# Patient Record
Sex: Female | Born: 1995 | Race: White | Hispanic: No | Marital: Single | Smoking: Never smoker
Health system: Southern US, Community
[De-identification: ages and names within clinical notes are randomized; demographics above are authoritative.]

## PROBLEM LIST (undated history)

## (undated) DIAGNOSIS — S42302A Unspecified fracture of shaft of humerus, left arm, initial encounter for closed fracture: Secondary | ICD-10-CM

## (undated) DIAGNOSIS — S42301A Unspecified fracture of shaft of humerus, right arm, initial encounter for closed fracture: Secondary | ICD-10-CM

## (undated) HISTORY — DX: Unspecified fracture of shaft of humerus, left arm, initial encounter for closed fracture: S42.302A

## (undated) HISTORY — PX: TONSILLECTOMY: SUR1361

## (undated) HISTORY — DX: Unspecified fracture of shaft of humerus, right arm, initial encounter for closed fracture: S42.301A

---
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
## 2016-05-02 ENCOUNTER — Encounter: Payer: Self-pay | Admitting: Family Medicine

## 2016-05-02 ENCOUNTER — Ambulatory Visit (INDEPENDENT_AMBULATORY_CARE_PROVIDER_SITE_OTHER): Payer: 59 | Admitting: Family Medicine

## 2016-05-02 VITALS — BP 109/66 | HR 86 | Wt 194.0 lb

## 2016-05-02 DIAGNOSIS — Z3009 Encounter for other general counseling and advice on contraception: Secondary | ICD-10-CM

## 2016-05-02 DIAGNOSIS — G43001 Migraine without aura, not intractable, with status migrainosus: Secondary | ICD-10-CM | POA: Diagnosis not present

## 2016-05-02 DIAGNOSIS — R41840 Attention and concentration deficit: Secondary | ICD-10-CM

## 2016-05-02 MED ORDER — SUMATRIPTAN SUCCINATE 100 MG PO TABS: 100.0000 mg | ORAL_TABLET | ORAL | 1 refills | Status: DC | PRN

## 2016-05-02 MED ORDER — PREDNISONE 10 MG PO TABS: ORAL_TABLET | ORAL | 0 refills | Status: DC

## 2016-05-02 MED ORDER — NORGESTIMATE-ETH ESTRADIOL 0.25-35 MG-MCG PO TABS: ORAL_TABLET | ORAL | 6 refills | Status: DC

## 2016-05-02 NOTE — Progress Notes (Signed)
Subjective:    CC: HAs  HPI:20 year old otherwise healthy female comes in today complaining of 3 weeks of frontal headaches. They sent to start in her 4 head and then radiates backward. She actually gets nauseated with them and rates her pain a 10 out of 10. She denies any visual changes. Her last eye exam was approximately a year ago. No real alleviating factors though turning off the lights does seem to help. No family history of migraine headaches. She describes it as a pressure type pain when it first starts. She does drink 2 caffeinated beverages (soda) per day and occasionally tea as well. Tried ibuprofen and Tylenol, neither relieved her symptoms. She feels like she is sleeping okay. No recent dietary changes. She does not exercise regularly.   She has been more stressed recently. 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 She says she typically goes in at 5 AM and works an 8 hour shift. She says she usually does not get a break and so doesn't get to eat or drink anything. She's been starting to experience some low back pain.  Having problems focusing and staying on task. She wonders if she may have ADD.  No family hx.  Denies feeling depressed.    She is also wanting to restart the birth control pill or to help regulate her menstrual cycles. She was producing on the patch but wants to switch back to the pill.  Past medical history, Surgical history, Family history not pertinant except as noted below, Social history, Allergies, and medications have been entered into the medical record, reviewed, and corrections made.   Review of Systems: No fevers, chills, night sweats, weight loss, chest pain, or shortness of breath.   Objective:    General: Well Developed, well nourished, and in no acute distress.  Neuro: Alert and oriented x3, extra-ocular muscles intact, sensation grossly intact.  HEENT: Normocephalic, atraumatic  Skin: Warm and dry, no rashes. Cardiac: Regular rate and rhythm,  no murmurs rubs or gallops, no lower extremity edema.  Respiratory: Clear to auscultation bilaterally. Not using accessory muscles, speaking in full sentences.   Impression and Recommendations:   Migraine headaches, without aura-discussed treatment. For now will give her prednisone taper to try to break her status migrainous. Did encourage her to stop all caffeine products. Encouraged her to make sure she is getting at least 8 hours of sleep at night and resting well. Also discussed using sumatriptan for rescue. Follow-up in 8 weeks. Encouraged her to keep a headache diary as well.  Contraceptive counseling-we'll restart her birth control.   Inattention-screening questionnaire given and completed today.   Did encourage her to work on reducing her stress levels and discussing that she does need a break at work. Legally they have to give her one if she is working an 8 hour shift. If she's not going to the bathroom her at high risk of having urinary tract infections and if she's not at least able to eat and 20 that is likely going to contribute and worsen her headaches.

## 2016-05-02 NOTE — Patient Instructions (Signed)
Please try to keep a headache diary. Track days that she have headaches and how severe they are and whether or not you took medication and if the medication worked. Really work hard to cut out caffeine. Make sure you are taking plenty of water and working to reduce her stress levels.

## 2016-05-08 ENCOUNTER — Telehealth: Payer: Self-pay | Admitting: Family Medicine

## 2016-05-08 NOTE — Telephone Encounter (Signed)
Please call patient: She did screen positive on the adult ADHD RS 4 screening questionnaire. Her score was 48. If she would like to be worked hard for formal testing [place] therapist there who can formally screen her for ADD and possibly ADHD.

## 2016-05-08 NOTE — Telephone Encounter (Signed)
lvm informing pt that she can call back if she would like to have a referral to be formally screened for ADD/ADHD. Asked that she call the office back to let us know if she would like to proceed.Loralee PacasBarkley, Taino Maertens AlmiraLynetta

## 2016-05-09 ENCOUNTER — Telehealth: Payer: Self-pay

## 2016-05-09 DIAGNOSIS — F4320 Adjustment disorder, unspecified: Secondary | ICD-10-CM

## 2016-05-09 NOTE — Telephone Encounter (Signed)
Referral placed.

## 2016-07-03 ENCOUNTER — Ambulatory Visit: Payer: Self-pay | Admitting: Family Medicine

## 2016-07-03 DIAGNOSIS — G43001 Migraine without aura, not intractable, with status migrainosus: Secondary | ICD-10-CM | POA: Insufficient documentation

## 2016-07-03 NOTE — Progress Notes (Deleted)
Subjective:    CC: Migraine HA  HPI: Follow-up migraine headaches without aura-  Past medical history, Surgical history, Family history not pertinant except as noted below, Social history, Allergies, and medications have been entered into the medical record, reviewed, and corrections made.   Review of Systems: No fevers, chills, night sweats, weight loss, chest pain, or shortness of breath.   Objective:    General: Well Developed, well nourished, and in no acute distress.  Neuro: Alert and oriented x3, extra-ocular muscles intact, sensation grossly intact.  HEENT: Normocephalic, atraumatic  Skin: Warm and dry, no rashes. Cardiac: Regular rate and rhythm, no murmurs rubs or gallops, no lower extremity edema.  Respiratory: Clear to auscultation bilaterally. Not using accessory muscles, speaking in full sentences.   Impression and Recommendations:    [place], w/o aura -

## 2016-12-29 ENCOUNTER — Ambulatory Visit (INDEPENDENT_AMBULATORY_CARE_PROVIDER_SITE_OTHER): Payer: 59 | Admitting: Family Medicine

## 2016-12-29 ENCOUNTER — Encounter: Payer: Self-pay | Admitting: Family Medicine

## 2016-12-29 VITALS — BP 123/65 | HR 99 | Ht 60.0 in | Wt 175.0 lb

## 2016-12-29 DIAGNOSIS — R350 Frequency of micturition: Secondary | ICD-10-CM

## 2016-12-29 DIAGNOSIS — R319 Hematuria, unspecified: Secondary | ICD-10-CM | POA: Diagnosis not present

## 2016-12-29 DIAGNOSIS — N912 Amenorrhea, unspecified: Secondary | ICD-10-CM

## 2016-12-29 DIAGNOSIS — F39 Unspecified mood [affective] disorder: Secondary | ICD-10-CM

## 2016-12-29 DIAGNOSIS — R110 Nausea: Secondary | ICD-10-CM

## 2016-12-29 DIAGNOSIS — R4586 Emotional lability: Secondary | ICD-10-CM

## 2016-12-29 DIAGNOSIS — R41840 Attention and concentration deficit: Secondary | ICD-10-CM

## 2016-12-29 LAB — POCT URINALYSIS DIPSTICK
Bilirubin, UA: NEGATIVE
Glucose, UA: NEGATIVE
KETONES UA: NEGATIVE
Leukocytes, UA: NEGATIVE
Nitrite, UA: NEGATIVE
PROTEIN UA: NEGATIVE
Spec Grav, UA: 1.02 (ref 1.010–1.025)
UROBILINOGEN UA: 1 U/dL
pH, UA: 7 (ref 5.0–8.0)

## 2016-12-29 LAB — POCT GLYCOSYLATED HEMOGLOBIN (HGB A1C): Hemoglobin A1C: 4.4

## 2016-12-29 LAB — POCT URINE PREGNANCY: Preg Test, Ur: NEGATIVE

## 2016-12-29 MED ORDER — NORGESTIMATE-ETH ESTRADIOL 0.25-35 MG-MCG PO TABS: ORAL_TABLET | ORAL | 6 refills | Status: DC

## 2016-12-29 NOTE — Progress Notes (Signed)
Subjective:    CC: f/u several issues.   HPI:  Also wants to move forward with being tested for ADD/ADHD-I did a screening evaluation back in September and she screened positive. At that time we did offer to refer her. She says that her mother cut her phone off since it did not receive phone calls. She now has a new phone number.  Amenorrhea since Feb, she would like to be tested for pregnancy. She has been off her birth control. She says when she moved out of her mom's home, the threw away all her medications. She has been actually active.   She also like to be evaluated for bipolar disorder. She reports mood swings that are quite severe. Sometimes she will miss to manic like she's on top of the world and having a great day. This can last hours or up to a whole day but usually not more than one day. And then will go the complete opposite until down. She also notices high levels of irritability and feeling mad and angry until she can get someone to calm her down. She denies any drug use.  Reports that she's been having some urinary frequency and some nausea for several weeks. No blood in his urine. No fevers chills or sweats. She does have a family history diabetes.  She is currently unemployed but looking for a job and living with friends.  Past medical history, Surgical history, Family history not pertinant except as noted below, Social history, Allergies, and medications have been entered into the medical record, reviewed, and corrections made.   Review of Systems: No fevers, chills, night sweats, weight loss, chest pain, or shortness of breath.   Objective:    General: Well Developed, well nourished, and in no acute distress.  Neuro: Alert and oriented x3, extra-ocular muscles intact, sensation grossly intact.  HEENT: Normocephalic, atraumatic  Skin: Warm and dry, no rashes. Cardiac: Regular rate and rhythm, no murmurs rubs or gallops, no lower extremity edema.  Respiratory: Clear to  auscultation bilaterally. Not using accessory muscles, speaking in full sentences.   Impression and Recommendations:   Inattention-will refer for formal testing. She denies any phone numbers to hopefully we can get her scheduled.  Amenorrhea-negative pregnancy test. Encouraged her to restart birth control. I'll send over new perception to the pharmacy.  Urinary frequency-we'll do a urinalysis and also check for diabetes. Hemoglobin A1c was 4.4 which is normal. Urinalysis showed moderate blood but negative nitrites and leukocytes. Sent for urine culture and micro review.   Mood swings-did do a mood questionnaire on her. She did screen positive cerumen get her referred to psychiatry for more formal evaluation or possible bipolar disorder. She answered yes to 11 questions.

## 2016-12-30 LAB — URINALYSIS, MICROSCOPIC ONLY
Casts: NONE SEEN [LPF]
Crystals: NONE SEEN [HPF]
Yeast: NONE SEEN [HPF]

## 2016-12-31 LAB — URINE CULTURE

## 2018-01-03 DIAGNOSIS — O329XX0 Maternal care for malpresentation of fetus, unspecified, not applicable or unspecified: Secondary | ICD-10-CM | POA: Insufficient documentation

## 2018-01-19 DIAGNOSIS — O480 Post-term pregnancy: Secondary | ICD-10-CM | POA: Insufficient documentation

## 2018-01-22 DIAGNOSIS — E669 Obesity, unspecified: Secondary | ICD-10-CM | POA: Insufficient documentation

## 2018-03-01 LAB — HM PAP SMEAR: HM Pap smear: NORMAL

## 2018-05-13 ENCOUNTER — Emergency Department (INDEPENDENT_AMBULATORY_CARE_PROVIDER_SITE_OTHER)
Admission: EM | Admit: 2018-05-13 | Discharge: 2018-05-13 | Disposition: A | Discharge status: Home / Self Care | Payer: 59 | Source: Home / Self Care | Attending: Family Medicine | Admitting: Family Medicine

## 2018-05-13 ENCOUNTER — Other Ambulatory Visit: Payer: Self-pay

## 2018-05-13 DIAGNOSIS — J019 Acute sinusitis, unspecified: Secondary | ICD-10-CM

## 2018-05-13 DIAGNOSIS — J029 Acute pharyngitis, unspecified: Secondary | ICD-10-CM

## 2018-05-13 LAB — POCT RAPID STREP A (OFFICE): Rapid Strep A Screen: NEGATIVE

## 2018-05-13 MED ORDER — AMOXICILLIN-POT CLAVULANATE 875-125 MG PO TABS: 1.0000 | ORAL_TABLET | Freq: Two times a day (BID) | ORAL | 0 refills | Status: DC

## 2018-05-13 NOTE — ED Provider Notes (Signed)
[facility]    CSN: [id number] Arrival date & time: 05/13/18  1953     History   Chief Complaint Chief Complaint  Patient presents with  . Sore Throat  . Otalgia  . Emesis  . Dizziness    HPI Shirley Olson is a 22 y.o. female.   HPI Shirley Olson is a [age] y.o. female presenting to UC with c/o sore throat that started yesterday, nasal congestion, mild headache, and bilateral ear pain that started today.  She has tried OTC sinus medication w/o relief. Hx of sinus infections. Denies fever, chills, n/v/d.   Past Medical History:  Diagnosis Date  . Arm fracture, left    distal radius, buckle fracture  . Arm fracture, right 2009 for a   Distal radius    Patient Active Problem List   Diagnosis Date Noted  . Migraine without aura and with status migrainosus, not intractable 07/03/2016  . Adjustment disorder of adolescence 08/18/2011    Class: Acute  . Elevated liver enzymes 05/03/2011  . Overweight child 04/17/2011    Past Surgical History:  Procedure Laterality Date  . TONSILLECTOMY    . TONSILLECTOMY AND ADENOIDECTOMY  08/2006    OB History   None      Home Medications    Prior to Admission medications   Medication Sig Start Date End Date Taking? Authorizing Provider  amoxicillin-clavulanate (AUGMENTIN) 875-125 MG tablet Take 1 tablet by mouth 2 (two) times daily. One po bid x 7 days 05/13/18   Lurene ShadowPhelps, Jadene Stemmer O, PA-C  norgestimate-ethinyl estradiol (SPRINTEC 28) 0.25-35 MG-MCG tablet TAKE ONE TABLET BY MOUTH EVERY DAY 12/29/16   Agapito GamesMetheney, Catherine D, MD    Family History Family History  Problem Relation Age of Onset  . Depression Unknown   . Diabetes Unknown     Social History Social History   Tobacco Use  . Smoking status: Never Smoker  . Smokeless tobacco: Never Used  Substance Use Topics  . Alcohol use: No  . Drug use: No     Allergies   Patient has no known allergies.   Review of Systems Review of Systems  Constitutional:  Negative for chills and fever.  HENT: Positive for congestion, ear pain, postnasal drip, sinus pressure, sinus pain and sore throat. Negative for trouble swallowing and voice change.   Respiratory: Positive for cough. Negative for shortness of breath.   Cardiovascular: Negative for chest pain and palpitations.  Gastrointestinal: Negative for abdominal pain, diarrhea, nausea and vomiting.  Musculoskeletal: Negative for arthralgias, back pain and myalgias.  Skin: Negative for rash.  Neurological: Positive for headaches. Negative for dizziness and light-headedness.     Physical Exam Triage Vital Signs ED Triage Vitals  Enc Vitals Group     BP      Pulse      Resp      Temp      Temp src      SpO2      Weight      Height      Head Circumference      Peak Flow      Pain Score      Pain Loc      Pain Edu?      Excl. in GC?    No data found.  Updated Vital Signs BP 111/78 (BP Location: Right Arm)   Pulse (!) 127   Temp 98.8 F (37.1 C) (Oral)   Ht 5' (1.524 m)   Wt 177 lb (80.3 kg)  LMP 05/05/2018   SpO2 100%   BMI 34.57 kg/m   Visual Acuity Right Eye Distance:   Left Eye Distance:   Bilateral Distance:    Right Eye Near:   Left Eye Near:    Bilateral Near:     Physical Exam  Constitutional: She is oriented to person, place, and time. She appears well-developed and well-nourished.  Non-toxic appearance. She does not appear ill. No distress.  HENT:  Head: Normocephalic and atraumatic.  Right Ear: Tympanic membrane normal.  Left Ear: Tympanic membrane normal.  Nose: Mucosal edema present. Right sinus exhibits maxillary sinus tenderness and frontal sinus tenderness. Left sinus exhibits maxillary sinus tenderness and frontal sinus tenderness.  Mouth/Throat: Uvula is midline, oropharynx is clear and moist and mucous membranes are normal.  Eyes: EOM are normal.  Neck: Normal range of motion. Neck supple.  Cardiovascular: Normal rate and regular rhythm.    Pulmonary/Chest: Effort normal and breath sounds normal.  Musculoskeletal: Normal range of motion.  Lymphadenopathy:    She has no cervical adenopathy.  Neurological: She is alert and oriented to person, place, and time.  Skin: Skin is warm and dry.  Psychiatric: She has a normal mood and affect. Her behavior is normal.  Nursing note and vitals reviewed.    UC Treatments / Results  Labs (all labs ordered are listed, but only abnormal results are displayed) Labs Reviewed  POCT RAPID STREP A (OFFICE)    EKG None  Radiology No results found.  Procedures Procedures (including critical care time)  Medications Ordered in UC Medications - No data to display  Initial Impression / Assessment and Plan / UC Course  I have reviewed the triage vital signs and the nursing notes.  Pertinent labs & imaging results that were available during my care of the patient were reviewed by me and considered in my medical decision making (see chart for details).     Hx and exam c/w sinus infection given sinus tenderness noted on exam.  Will tx with Augmentin Home instructions provided.   Final Clinical Impressions(s) / UC Diagnoses   Final diagnoses:  Acute rhinosinusitis  Pharyngitis, unspecified etiology     Discharge Instructions      You may take 500mg  acetaminophen every 4-6 hours or in combination with ibuprofen 400-600mg  every 6-8 hours as needed for pain, inflammation, and fever.  Be sure to drink at least eight 8oz glasses of water to stay well hydrated and get at least 8 hours of sleep at night, preferably more while sick.   Please take antibiotics as prescribed and be sure to complete entire course even if you start to feel better to ensure infection does not come back.  Please follow up with family medicine in 1 week if needed.     ED Prescriptions    Medication Sig Dispense Auth. Provider   amoxicillin-clavulanate (AUGMENTIN) 875-125 MG tablet Take 1 tablet by  mouth 2 (two) times daily. One po bid x 7 days 14 tablet Lurene Shadow, [place]     Controlled Substance Prescriptions [place] Controlled Substance Registry consulted? Not Applicable   Rolla Plate 05/14/18 1452

## 2018-05-13 NOTE — ED Triage Notes (Signed)
Pt stated that she started with a sore throat yesterday.  Headache, nasal congestion, and bilat ear pain today.

## 2018-05-13 NOTE — Discharge Instructions (Signed)
°  You may take 500mg  acetaminophen every 4-6 hours or in combination with ibuprofen 400-600mg  every 6-8 hours as needed for pain, inflammation, and fever.  Be sure to drink at least eight 8oz glasses of water to stay well hydrated and get at least 8 hours of sleep at night, preferably more while sick.   Please take antibiotics as prescribed and be sure to complete entire course even if you start to feel better to ensure infection does not come back.  Please follow up with family medicine in 1 week if needed.

## 2018-07-12 ENCOUNTER — Ambulatory Visit (INDEPENDENT_AMBULATORY_CARE_PROVIDER_SITE_OTHER): Payer: 59 | Admitting: Family Medicine

## 2018-07-12 ENCOUNTER — Encounter: Payer: Self-pay | Admitting: Family Medicine

## 2018-07-12 VITALS — BP 103/69 | HR 87 | Temp 98.0°F | Ht 60.0 in | Wt 179.5 lb

## 2018-07-12 DIAGNOSIS — F418 Other specified anxiety disorders: Secondary | ICD-10-CM | POA: Diagnosis not present

## 2018-07-12 MED ORDER — ESCITALOPRAM OXALATE 10 MG PO TABS: ORAL_TABLET | ORAL | 1 refills | Status: DC

## 2018-07-12 NOTE — Progress Notes (Signed)
n

## 2018-07-12 NOTE — Patient Instructions (Signed)

## 2018-07-12 NOTE — Progress Notes (Signed)
Subjective:    CC:   HPI:  22 year old female with a history of bipolar disorder comes in today to discuss restarting her psychiatric medications.  She just had a baby on May 25 and was seen in June for postpartum care.  Mom brought her in today because of mood swings.  I had seen her back in 2012 and diagnosed her with an unspecified mood disorder.  We had referred her to psychiatry for more confirmative testing and diagnosis.  Around that time she actually moved away for a couple years and then came back.  The baby's father is not currently involved in her life and she is taking care of her 46-monthold infant and living in her mother's home.  Her mother is well-known to me and has been met patient for years.  Mom reports that she is been having mood swings and irritability.  She will yell and get very angry very quickly.  Patient herself reports that she is sleeping okay.  She still waking up at night to feed the baby.  She has been taking good care of her child.  And there are no concerns there.  Does report feeling down and depressed more than half the days as well as trouble with sleep and feeling more tired.  She denies any thoughts of wanting to harm herself.  She also reports symptoms of anxiety and irritability.   Past medical history, Surgical history, Family history not pertinant except as noted below, Social history, Allergies, and medications have been entered into the medical record, reviewed, and corrections made.   Review of Systems: No fevers, chills, night sweats, weight loss, chest pain, or shortness of breath.   Objective:    General: Well Developed, well nourished, and in no acute distress.  Neuro: Alert and oriented x3, extra-ocular muscles intact, sensation grossly intact.  HEENT: Normocephalic, atraumatic  Skin: Warm and dry, no rashes. Cardiac: Regular rate and rhythm, no murmurs rubs or gallops, no lower extremity edema.  Respiratory: Clear to auscultation bilaterally.  Not using accessory muscles, speaking in full sentences.   Impression and Recommendations:    Depression/Anxiety-PHQ 9 score of 12 and gad 7 score of 8.  We discussed options.  She is not interested in therapy or counseling at this time but says she will consider it.  We discussed starting Lexapro.  Follow-up in about 4 weeks.  I do feel that mom is supportive she is helping to care for and provide for the newborn infant.  Mom and dad who are in the home work all day.  And [name] at home by herself most of the day with the baby.  She does not currently have any transportation she does not currently have any type of car insurance.  She does feel like not being able to get out of the house frequently is stressful.  Time spent 25 minutes, greater than 50% time spent face-to-face discussing depression and anxiety.

## 2018-08-09 ENCOUNTER — Encounter: Payer: Self-pay | Admitting: Family Medicine

## 2018-08-09 ENCOUNTER — Ambulatory Visit (INDEPENDENT_AMBULATORY_CARE_PROVIDER_SITE_OTHER): Payer: 59 | Admitting: Family Medicine

## 2018-08-09 ENCOUNTER — Telehealth: Payer: Self-pay | Admitting: Family Medicine

## 2018-08-09 VITALS — BP 107/55 | HR 90 | Ht 60.0 in | Wt 185.0 lb

## 2018-08-09 DIAGNOSIS — F339 Major depressive disorder, recurrent, unspecified: Secondary | ICD-10-CM | POA: Insufficient documentation

## 2018-08-09 DIAGNOSIS — F3132 Bipolar disorder, current episode depressed, moderate: Secondary | ICD-10-CM

## 2018-08-09 DIAGNOSIS — Z23 Encounter for immunization: Secondary | ICD-10-CM | POA: Diagnosis not present

## 2018-08-09 DIAGNOSIS — F418 Other specified anxiety disorders: Secondary | ICD-10-CM

## 2018-08-09 MED ORDER — QUETIAPINE FUMARATE 25 MG PO TABS: ORAL_TABLET | ORAL | 0 refills | Status: DC

## 2018-08-09 NOTE — Telephone Encounter (Signed)
Please call pt and find out where has last pap. It should hav ebeen while she was pregnant.

## 2018-08-09 NOTE — Progress Notes (Signed)
Subjective:    CC: 4 week f/u for mood.   HPI:  Here for f/u for depression/anxiety with ah history of Bipolar disorder -reports she has been taking Lexapro and says that she notices that she feels a little better in the mornings but almost feels like it wears off she still really struggling with some irritability.  Part of its the situation as well she is living in her parents home and feeling a little confined.  She is not able to drive and does not have her own vehicle so she is not able to leave when she wants to.  Still reports some feelings of feeling down nearly every day and feeling bad about herself in general.  She has had thoughts of being better off dead.  Also reports excessive worry and restlessness and high levels of irritability.  Past medical history, Surgical history, Family history not pertinant except as noted below, Social history, Allergies, and medications have been entered into the medical record, reviewed, and corrections made.   Review of Systems: No fevers, chills, night sweats, weight loss, chest pain, or shortness of breath.   Objective:    General: Well Developed, well nourished, and in no acute distress.  Neuro: Alert and oriented x3, extra-ocular muscles intact, sensation grossly intact.  HEENT: Normocephalic, atraumatic  Skin: Warm and dry, no rashes. Cardiac: Regular rate and rhythm, no murmurs rubs or gallops, no lower extremity edema.  Respiratory: Clear to auscultation bilaterally. Not using accessory muscles, speaking in full sentences.   Impression and Recommendations:    Depression/Anxiety/Bipolar D/O- cshe would really benefit from it benefit from some therapy or counseling but unfortunately she is unable to drive and so relies on family members for transportation.  She does not feel like she can get her regularly enough to benefit.  Thus options.  Open to try putting her on a mood stabilizer.  Will be to continue with Lexapro as well and then taper  off if she is doing well.  Flu vaccine given today.  20 minutes, greater than 50% of the time spent face-to-face counseling about mood disorder and medications.

## 2018-08-12 NOTE — Telephone Encounter (Signed)
Left VM for Pt to return clinic call.  

## 2018-08-13 NOTE — Telephone Encounter (Signed)
Please call Dr. Erenest BlankJackie Mims office for last pap smear

## 2018-08-13 NOTE — Telephone Encounter (Signed)
lvm asking pt to rtn call..Alani Sabbagh Lynetta, CMA  

## 2018-08-14 NOTE — Telephone Encounter (Signed)
Record requested.

## 2018-08-29 ENCOUNTER — Encounter: Payer: Self-pay | Admitting: Family Medicine

## 2018-09-06 ENCOUNTER — Ambulatory Visit: Payer: 59 | Admitting: Family Medicine

## 2018-09-08 ENCOUNTER — Other Ambulatory Visit: Payer: Self-pay | Admitting: Family Medicine

## 2019-02-18 ENCOUNTER — Other Ambulatory Visit: Payer: Self-pay | Admitting: Family Medicine

## 2019-04-03 ENCOUNTER — Encounter: Payer: Self-pay | Admitting: Family Medicine

## 2019-04-03 ENCOUNTER — Other Ambulatory Visit: Payer: Self-pay

## 2019-04-03 ENCOUNTER — Ambulatory Visit (INDEPENDENT_AMBULATORY_CARE_PROVIDER_SITE_OTHER): Payer: 59 | Admitting: Family Medicine

## 2019-04-03 VITALS — BP 107/67 | HR 86 | Temp 98.2°F | Wt 192.0 lb

## 2019-04-03 DIAGNOSIS — G5601 Carpal tunnel syndrome, right upper limb: Secondary | ICD-10-CM | POA: Diagnosis not present

## 2019-04-03 MED ORDER — GABAPENTIN 300 MG PO CAPS: ORAL_CAPSULE | ORAL | 3 refills | Status: DC

## 2019-04-03 NOTE — Patient Instructions (Signed)
Thank you for coming in today. I think you have carpal tunnel syndrome.  Use the wrist brace at night and during the day if needed.  Take gabapentin for nerve pain. Take at night at first and advance to 3x daily as needed.  Recheck in 4 weeks.  Return sooner if needed.    Carpal Tunnel Syndrome  Carpal tunnel syndrome is a condition that causes pain in your hand and arm. The carpal tunnel is a narrow area that is on the palm side of your wrist. Repeated wrist motion or certain diseases may cause swelling in the tunnel. This swelling can pinch the main nerve in the wrist (median nerve). What are the causes? This condition may be caused by:  Repeated wrist motions.  Wrist injuries.  Arthritis.  A sac of fluid (cyst) or abnormal growth (tumor) in the carpal tunnel.  Fluid buildup during pregnancy. Sometimes the cause is not known. What increases the risk? The following factors may make you more likely to develop this condition:  Having a job in which you move your wrist in the same way many times. This includes jobs like being a Midwifebutcher or a Conservation officer, naturecashier.  Being a woman.  Having other health conditions, such as: ? Diabetes. ? Obesity. ? A thyroid gland that is not active enough (hypothyroidism). ? Kidney failure. What are the signs or symptoms? Symptoms of this condition include:  A tingling feeling in your fingers.  Tingling or a loss of feeling (numbness) in your hand.  Pain in your entire arm. This pain may get worse when you bend your wrist and elbow for a long time.  Pain in your wrist that goes up your arm to your shoulder.  Pain that goes down into your palm or fingers.  A weak feeling in your hands. You may find it hard to grab and hold items. You may feel worse at night. How is this diagnosed? This condition is diagnosed with a medical history and physical exam. You may also have tests, such as:  Electromyogram (EMG). This test checks the signals that the nerves  send to the muscles.  Nerve conduction study. This test checks how well signals pass through your nerves.  Imaging tests, such as X-rays, ultrasound, and MRI. These tests check for what might be the cause of your condition. How is this treated? This condition may be treated with:  Lifestyle changes. You will be asked to stop or change the activity that caused your problem.  Doing exercise and activities that make bones and muscles stronger (physical therapy).  Learning how to use your hand again (occupational therapy).  Medicines for pain and swelling (inflammation). You may have injections in your wrist.  A wrist splint.  Surgery. Follow these instructions at home: If you have a splint:  Wear the splint as told by your doctor. Remove it only as told by your doctor.  Loosen the splint if your fingers: ? Tingle. ? Lose feeling (become numb). ? Turn cold and blue.  Keep the splint clean.  If the splint is not waterproof: ? Do not let it get wet. ? Cover it with a watertight covering when you take a bath or a shower. Managing pain, stiffness, and swelling   If told, put ice on the painful area: ? If you have a removable splint, remove it as told by your doctor. ? Put ice in a plastic bag. ? Place a towel between your skin and the bag. ? Leave the ice on  for 20 minutes, 2-3 times per day. General instructions  Take over-the-counter and prescription medicines only as told by your doctor.  Rest your wrist from any activity that may cause pain. If needed, talk with your boss at work about changes that can help your wrist heal.  Do any exercises as told by your doctor, physical therapist, or occupational therapist.  Keep all follow-up visits as told by your doctor. This is important. Contact a doctor if:  You have new symptoms.  Medicine does not help your pain.  Your symptoms get worse. Get help right away if:  You have very bad numbness or tingling in your wrist  or hand. Summary  Carpal tunnel syndrome is a condition that causes pain in your hand and arm.  It is often caused by repeated wrist motions.  Lifestyle changes and medicines are used to treat this problem. Surgery may help in very bad cases.  Follow your doctor's instructions about wearing a splint, resting your wrist, keeping follow-up visits, and calling for help. This information is not intended to replace advice given to you by your health care provider. Make sure you discuss any questions you have with your health care provider. Document Released: 08/03/2011 Document Revised: 12/21/2017 Document Reviewed: 12/21/2017 Elsevier Patient Education  2020 [employer].

## 2019-04-03 NOTE — Progress Notes (Signed)
Shirley Olson is a 23 y.o. female who [facility] for right hand and wrist pain. Present for about 1 year. Tried tylenol did not work well.  Worse in radial 1st 3 digitis.  She notes the symptoms worsened a few months ago.  She started working in the [employer] in March.  She notes her symptoms are typically worse when she is using her hands such as washing dishes or feeding her 2551-month-old daughter.  She notes sometimes the symptoms will wake her from sleep as well.  She has predominantly she has pain and numbness and tingling into the radial aspect of her palmar hand.  She denies any symptoms into her fifth digit.  She is right-hand dominant and as noted above has a 10151-month-old daughter.  She denies any injury or prior history of carpal tunnel syndrome. ROS:  As above  Exam:  BP 107/67   Pulse 86   Temp 98.2 F (36.8 C) (Oral)   Wt 192 lb (87.1 kg)   BMI 37.50 kg/m  Wt Readings from Last 5 Encounters:  04/03/19 192 lb (87.1 kg)  08/09/18 185 lb (83.9 kg)  07/12/18 179 lb 8 oz (81.4 kg)  05/13/18 177 lb (80.3 kg)  12/29/16 175 lb (79.4 kg)   General: Well Developed, well nourished, and in no acute distress.  Neuro/Psych: Alert and oriented x3, extra-ocular muscles intact, able to move all 4 extremities, sensation grossly intact. Skin: Warm and dry, no rashes noted.  Respiratory: Not using accessory muscles, speaking in full sentences, trachea midline.  Cardiovascular: Pulses palpable, no extremity edema. Abdomen: Does not appear distended. MSK:  Right hand and wrist normal-appearing normal motion. Not particular tender to palpation. Positive Tinel's at carpal tunnel.  Positive Phalen's test. Negative Finkelstein's test. Intact strength.  Contralateral left wrist normal-appearing nontender normal motion negative Tinel's Finkel and Phalen's test.    Lab and Radiology Results Limited musculoskeletal  ultrasound of right carpal tunnel reveals enlarged median nerve at 12 mm.  Normal other tendinous structures.  Normal bony structures. Impression: Carpal tunnel syndrome     Assessment and Plan: 23 y.o. female with right hand and wrist pain very likely carpal tunnel syndrome.  Plan for after discussion cock-up wrist splint and trial of gabapentin.  Recheck back in about a month.  If not improving next up would be injection.   PDMP not reviewed this encounter. No orders of the defined types were placed in this encounter.  Meds ordered this encounter  Medications  . gabapentin (NEURONTIN) 300 MG capsule    Sig: One tab PO qHS for a week, then BID for a week, then TID. May double weekly to a max of 3,600mg /day    Dispense:  180 capsule    Refill:  3    Historical information moved to improve visibility of documentation.  Past Medical History:  Diagnosis Date  . Arm fracture, left    distal radius, buckle fracture  . Arm fracture, right 2009 for a   Distal radius   Past Surgical History:  Procedure Laterality Date  . TONSILLECTOMY    . TONSILLECTOMY AND ADENOIDECTOMY  08/2006   Social History   Tobacco Use  . Smoking status: Never Smoker  . Smokeless tobacco: Never Used  Substance Use Topics  . Alcohol use: No   family history includes Depression in her unknown relative; Diabetes in her unknown relative.  Medications: Current Outpatient Medications  Medication Sig Dispense Refill  .  escitalopram (LEXAPRO) 10 MG tablet Take 1 tablet (10 mg total) by mouth daily. 30 tablet 1  . QUEtiapine (SEROQUEL) 25 MG tablet Take 2 tablets (50 mg total) by mouth 2 (two) times daily. [place]. LAST REFILL.MUST SCHEDULE/KEEP APPOINTMENT FOR REFILLS 120 tablet 0  . XULANE 150-35 MCG/24HR transdermal patch Apply 1 patch(es) every week by transdermal route.  4  . gabapentin (NEURONTIN) 300 MG capsule One tab PO qHS for a week, then BID for a week, then TID. May double weekly to a  max of 3,600mg /day 180 capsule 3   No current facility-administered medications for this visit.    No Known Allergies    Discussed warning signs or symptoms. Please see discharge instructions. Patient expresses understanding.

## 2019-04-12 ENCOUNTER — Other Ambulatory Visit: Payer: Self-pay | Admitting: Family Medicine

## 2019-05-01 ENCOUNTER — Ambulatory Visit: Payer: 59 | Admitting: Family Medicine

## 2019-05-02 ENCOUNTER — Other Ambulatory Visit: Payer: Self-pay

## 2019-05-02 ENCOUNTER — Ambulatory Visit: Payer: 59 | Admitting: Family Medicine

## 2019-05-02 ENCOUNTER — Ambulatory Visit (INDEPENDENT_AMBULATORY_CARE_PROVIDER_SITE_OTHER): Payer: 59

## 2019-05-02 ENCOUNTER — Encounter: Payer: Self-pay | Admitting: *Deleted

## 2019-05-02 ENCOUNTER — Ambulatory Visit (INDEPENDENT_AMBULATORY_CARE_PROVIDER_SITE_OTHER): Payer: 59 | Admitting: Family Medicine

## 2019-05-02 ENCOUNTER — Other Ambulatory Visit: Payer: Self-pay | Admitting: Family Medicine

## 2019-05-02 ENCOUNTER — Encounter: Payer: Self-pay | Admitting: Family Medicine

## 2019-05-02 VITALS — BP 103/56 | HR 108 | Temp 98.6°F | Ht 60.0 in | Wt 198.0 lb

## 2019-05-02 DIAGNOSIS — M79671 Pain in right foot: Secondary | ICD-10-CM | POA: Insufficient documentation

## 2019-05-02 DIAGNOSIS — F3132 Bipolar disorder, current episode depressed, moderate: Secondary | ICD-10-CM | POA: Diagnosis not present

## 2019-05-02 DIAGNOSIS — Z23 Encounter for immunization: Secondary | ICD-10-CM

## 2019-05-02 MED ORDER — ARIPIPRAZOLE 2 MG PO TABS: ORAL_TABLET | ORAL | 0 refills | Status: DC

## 2019-05-02 MED ORDER — FLUOXETINE HCL 20 MG PO TABS: ORAL_TABLET | ORAL | 0 refills | Status: DC

## 2019-05-02 NOTE — Assessment & Plan Note (Addendum)
PHQ 9 score of 13 and gad 7 score of 8.  She rates her depression and anxiety symptoms as somewhat difficult.  We discussed several options.  She felt like the Seroquel is too sedating so she skips her dose if she is going to work and she does work full-time which means she is giving her dose probably between 3-5 times per week.  So we will try switching her to Abilify.  We will start with 2 mg and then uptitrate to 40 mg.  Like to see her back in 1 month.  Just stressed the importance of her returning so that we can make sure the medication is working and its effective.  She was not convinced that the Lexapro was helping her with some of her irritability and anxiety so we discussed switching to fluoxetine instead.  Again follow-up in 1 month.  We discussed behavioral health referral.  They are now doing virtual visit so actually think this would work well for her.  We discussed it back in December but her hesitation at that time was transportation as she was having to rely on her mother to take her to work and her mother was also working.

## 2019-05-02 NOTE — Assessment & Plan Note (Signed)
Right foot pain-unclear etiology at this point she clearly had an injury about 2 years ago.  Possible old fracture unclear.  Her pain is distributed over a wide area.  It is over the distal foot and over the medial and lateral portion of the foot.

## 2019-05-02 NOTE — Progress Notes (Signed)
Established Patient Office Visit  Subjective:  Patient ID: Shirley Olson, female    DOB: [birth year]  Age: 23 y.o. MRN: [id number]   CC:  Chief Complaint  Patient presents with  . mood  . Foot Pain    HPI [name] presents for F/U Bipolar Disorder.  Last time I saw her was in December so approximately 9 months ago.  At that time we decided to start her on Seroquel a mood stabilizer with her Lexapro that she was already taking.  According to our prescribing information she would have run out of Lexapro in February and she would have run out of the Seroquel in July.  Mom who is not here with her but who is also a patient is very concerned about some more recent behaviors.  She is living with her parents and taking care of her only child.  She reports feeling down and depressed more than half the days as well as having low energy and having difficulty with concentration and feeling bad about herself.  She also reports feeling like she cannot control her worry several days of the week and feeling irritable more than half of the time.  She has had thoughts of being better off dead several times.  She also complains of right foot pain.  She said she actually fell down a hill about 2 years ago when she was living in [place].  At the time she never sought any kind of medical care and had large bruises down her right leg and into her foot at the time.  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  She says it gets stiff and sore to the point that she starts limping into her shift.  Most of her pain is on the top part of her foot near her toes and on the medial and lateral part of the foot.  She also occasionally gets some pain near the Achilles tendon.  She does wear tennis shoes to work.  Past Medical History:  Diagnosis Date  . Arm fracture, left    distal  radius, buckle fracture  . Arm fracture, right 2009 for a   Distal radius    Past Surgical History:  Procedure Laterality Date  . TONSILLECTOMY    . TONSILLECTOMY AND ADENOIDECTOMY  08/2006    Family History  Problem Relation Age of Onset  . Depression Unknown   . Diabetes Unknown     Social History   Socioeconomic History  . Marital status: Single    Spouse name: Not on file  . Number of children: Not on file  . Years of education: Not on file  . Highest education level: Not on file  Occupational History  . Occupation: Ship broker  Social Needs  . Financial resource strain: Not on file  . Food insecurity    Worry: Not on file    Inability: Not on file  . Transportation needs    Medical: Not on file    Non-medical: Not on file  Tobacco Use  . Smoking status: Never Smoker  . Smokeless tobacco: Never Used  Substance and Sexual Activity  . Alcohol use: No  . Drug use: No  . Sexual activity: Never    Comment: Lives with both parents, no sports, attends school.  Lifestyle  . Physical activity    Days  per week: Not on file    Minutes per session: Not on file  . Stress: Not on file  Relationships  . Social Herbalist on phone: Not on file    Gets together: Not on file    Attends religious service: Not on file    Active member of club or organization: Not on file    Attends meetings of clubs or organizations: Not on file    Relationship status: Not on file  . Intimate partner violence    Fear of current or ex partner: Not on file    Emotionally abused: Not on file    Physically abused: Not on file    Forced sexual activity: Not on file  Other Topics Concern  . Not on file  Social History Narrative    No sports.      Outpatient Medications Prior to Visit  Medication Sig Dispense Refill  . gabapentin (NEURONTIN) 300 MG capsule One tab PO qHS for a week, then BID for a week, then TID. May double weekly to a max of 3,633m/day 180 capsule 3  . XULANE  150-35 MCG/24HR transdermal patch Apply 1 patch(es) every week by transdermal route.  4  . escitalopram (LEXAPRO) 10 MG tablet Take 1 tablet (10 mg total) by mouth daily. 30 tablet 1  . QUEtiapine (SEROQUEL) 25 MG tablet Take 2 tablets (50 mg total) by mouth 2 (two) times daily. 3[place] LAST REFILL.MUST SCHEDULE/KEEP APPOINTMENT FOR REFILLS 120 tablet 0   No facility-administered medications prior to visit.     No Known Allergies  ROS Review of Systems    Objective:    Physical Exam  Constitutional: She is oriented to person, place, and time. She appears well-developed and well-nourished.  HENT:  Head: Normocephalic and atraumatic.  Eyes: Conjunctivae and EOM are normal.  Cardiovascular: Normal rate, regular rhythm and normal heart sounds.  Pulmonary/Chest: Effort normal and breath sounds normal.  Musculoskeletal:     Comments: Right ankle with no significant swelling.  No bruising.  She is tender over the distal metatarsal heads but most tender over the third fourth and fifth metatarsal heads.  She is also tender all the way down the first and fifth metatarsals.  She has pain with eversion of the foot he is able to flex and extend normally.  Dorsal pedal pulse 2+.  Capillary refill less than 3 seconds.  Neurological: She is alert and oriented to person, place, and time.  Skin: Skin is warm and dry. No pallor.  Psychiatric: She has a normal mood and affect. Her behavior is normal.  Vitals reviewed.   BP (!) 103/56   Pulse (!) 108   Temp 98.6 F (37 C)   Ht 5' (1.524 m)   Wt 198 lb (89.8 kg)   LMP 04/11/2019 (Exact Date)   SpO2 100%   BMI 38.67 kg/m  Wt Readings from Last 3 Encounters:  05/02/19 198 lb (89.8 kg)  04/03/19 192 lb (87.1 kg)  08/09/18 185 lb (83.9 kg)     Health Maintenance Due  Topic Date Due  . INFLUENZA VACCINE  03/29/2019    There are no preventive care reminders to display for this patient.  Lab Results  Component Value Date   TSH  0.801 06/27/2011   Lab Results  Component Value Date   WBC 6.5 06/27/2011   HGB 13.5 06/27/2011   HCT 40.1 06/27/2011   MCV 85.9 06/27/2011   PLT 218 06/27/2011  No results found for: NA, K, CHLORIDE, CO2, GLUCOSE, BUN, CREATININE, BILITOT, ALKPHOS, AST, ALT, PROT, ALBUMIN, CALCIUM, ANIONGAP, EGFR, GFR No results found for: CHOL No results found for: HDL No results found for: LDLCALC No results found for: TRIG No results found for: CHOLHDL Lab Results  Component Value Date   HGBA1C 4.4 12/29/2016      Assessment & Plan:   Problem List Items Addressed This Visit      Other   Right foot pain    Right foot pain-unclear etiology at this point she clearly had an injury about 2 years ago.  Possible old fracture unclear.  Her pain is distributed over a wide area.  It is over the distal foot and over the medial and lateral portion of the foot.      Relevant Orders   DG Foot Complete Right   Bipolar affective disorder, currently depressed, moderate (HCC) - Primary    PHQ 9 score of 13 and gad 7 score of 8.  She rates her depression and anxiety symptoms as somewhat difficult.  We discussed several options.  She felt like the Seroquel is too sedating so she skips her dose if she is going to work and she does work full-time which means she is giving her dose probably between 3-5 times per week.  So we will try switching her to Abilify.  We will start with 2 mg and then uptitrate to 40 mg.  Like to see her back in 1 month.  Just stressed the importance of her returning so that we can make sure the medication is working and its effective.  She was not convinced that the Lexapro was helping her with some of her irritability and anxiety so we discussed switching to fluoxetine instead.  Again follow-up in 1 month.  We discussed behavioral health referral.  They are now doing virtual visit so actually think this would work well for her.  We discussed it back in December but her hesitation at that  time was transportation as she was having to rely on her mother to take her to work and her mother was also working.      Relevant Medications   ARIPiprazole (ABILIFY) 2 MG tablet   FLUoxetine (PROZAC) 20 MG tablet   Other Relevant Orders   Ambulatory referral to [facility]    Other Visit Diagnoses    Need for immunization against influenza       Relevant Orders   Flu Vaccine QUAD 36+ mos IM (Completed)      Meds ordered this encounter  Medications  . ARIPiprazole (ABILIFY) 2 MG tablet    Sig: Take 1 tablet (2 mg total) by mouth daily for 7 days, THEN 2 tablets (4 mg total) daily for 23 days.    Dispense:  53 tablet    Refill:  0  . FLUoxetine (PROZAC) 20 MG tablet    Sig: 1/2 tab po QD x 8 days the increase to whole tab    Dispense:  30 tablet    Refill:  0    Follow-up: Return in about 4 weeks (around 05/30/2019) for New start medication.    Beatrice Lecher, MD

## 2019-05-30 ENCOUNTER — Ambulatory Visit: Payer: 59 | Admitting: Family Medicine

## 2019-06-10 ENCOUNTER — Ambulatory Visit (INDEPENDENT_AMBULATORY_CARE_PROVIDER_SITE_OTHER): Payer: 59 | Admitting: Family Medicine

## 2019-06-10 ENCOUNTER — Encounter: Payer: Self-pay | Admitting: Family Medicine

## 2019-06-10 ENCOUNTER — Other Ambulatory Visit: Payer: Self-pay

## 2019-06-10 VITALS — BP 95/57 | HR 69 | Ht 60.0 in | Wt 195.0 lb

## 2019-06-10 DIAGNOSIS — R41840 Attention and concentration deficit: Secondary | ICD-10-CM | POA: Diagnosis not present

## 2019-06-10 DIAGNOSIS — F3132 Bipolar disorder, current episode depressed, moderate: Secondary | ICD-10-CM | POA: Diagnosis not present

## 2019-06-10 MED ORDER — FLUOXETINE HCL 20 MG PO TABS: 20.0000 mg | ORAL_TABLET | Freq: Every day | ORAL | 1 refills | Status: DC

## 2019-06-10 MED ORDER — ARIPIPRAZOLE 5 MG PO TABS: 5.0000 mg | ORAL_TABLET | Freq: Every day | ORAL | 0 refills | Status: DC

## 2019-06-10 NOTE — Assessment & Plan Note (Signed)
Actually placed a referral back in May to [facility] for adult ADD evaluation.  The typically done with Dr. Lurline Hare.  She says she never heard back from our office and I could not find any specific notes in regards to attempts to contact her.  We will speak with our referral coordinator to see if they can call their office setting get her rescheduled if possible.  Also discussed the importance of getting her mood under better control to better evaluate whether or not she has ADD or not

## 2019-06-10 NOTE — Assessment & Plan Note (Signed)
Doing well on Abilify so far.  She is currently taking 4 mg will increase to 5 mg dose that she can take a single tab daily.  Also continue with fluoxetine.  Plan to follow-up in 1 month.  We will work on getting her in with therapist/counselor.  It looks like she does have an appointment scheduled on the 16th at 10 AM but says she is not gotten any notification about how to connect with video visit so we will try to get that information and get back with her so that she can make her appointment.

## 2019-06-10 NOTE — Progress Notes (Signed)
Established Patient Office Visit  Subjective:  Patient ID: Shirley Olson, female    DOB: [birth year]  Age: 23 y.o. MRN: [id number]  CC:  Chief Complaint  Patient presents with  . mood    HPI [name] presents for follow-up of bipolar disorder.  She had previously been taking Seroquel but says that she ended up skipping it quite frequently because it was making her feel tired and sedated.  She has now been on the Abilify for close to 4 weeks and says this has been better.  She does not feel nearly as sleepy and sedated as she does on the Seroquel.  She has not noticed any negative side effects.  She is up to 4 mg and tolerating it well.  She says she has been taking the fluoxetine 20 mg daily as well.  She still reports feeling down and depressed nearly every day and feeling bad about herself nearly every day.  She also reports excessive worrying and trouble relaxing.  She has had thoughts of wanting to harm herself more than half the time.  We did try to get her set up with a therapist and she was supposed to have a virtual appointment on the eighth but says no one ever called her during that time.  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  Initially made a referral back in May to get her formally diagnosed and evaluated but says she never heard back from their office.  She is also now in a boot for her right foot.  When she was here previously she had complained about persistent right foot pain.  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  Her foot gradually got worse over the last month and she ended up seeing or the orthopedist.  Past Medical History:  Diagnosis Date  . Arm fracture, left    distal radius, buckle fracture  . Arm fracture, right 2009 for a   Distal radius    Past Surgical History:  Procedure Laterality Date  . TONSILLECTOMY    . TONSILLECTOMY AND ADENOIDECTOMY  08/2006    Family History   Problem Relation Age of Onset  . Depression Unknown   . Diabetes Unknown     Social History   Socioeconomic History  . Marital status: Single    Spouse name: Not on file  . Number of children: Not on file  . Years of education: Not on file  . Highest education level: Not on file  Occupational History  . Occupation: Ship broker  Social Needs  . Financial resource strain: Not on file  . Food insecurity    Worry: Not on file    Inability: Not on file  . Transportation needs    Medical: Not on file    Non-medical: Not on file  Tobacco Use  . Smoking status: Never Smoker  . Smokeless tobacco: Never Used  Substance and Sexual Activity  . Alcohol use: No  . Drug use: No  . Sexual activity: Never    Comment: Lives with both parents, no sports, attends school.  Lifestyle  . Physical activity    Days per week: Not on file    Minutes per session: Not on file  . Stress: Not on file  Relationships  . Social Herbalist on phone: Not on file    Gets together: Not on file    Attends religious service: Not on file  Active member of club or organization: Not on file    Attends meetings of clubs or organizations: Not on file    Relationship status: Not on file  . Intimate partner violence    Fear of current or ex partner: Not on file    Emotionally abused: Not on file    Physically abused: Not on file    Forced sexual activity: Not on file  Other Topics Concern  . Not on file  Social History Narrative    No sports.      Outpatient Medications Prior to Visit  Medication Sig Dispense Refill  . gabapentin (NEURONTIN) 300 MG capsule One tab PO qHS for a week, then BID for a week, then TID. May double weekly to a max of 3,668m/day 180 capsule 3  . XULANE 150-35 MCG/24HR transdermal patch Apply 1 patch(es) every week by transdermal route.  4  . ARIPiprazole (ABILIFY) 2 MG tablet Take 1 tablet (2 mg total) by mouth daily for 7 days, THEN 2 tablets (4 mg total) daily for 23  days. 53 tablet 0  . FLUoxetine (PROZAC) 20 MG tablet Take 20 mg by mouth daily.    .Marland KitchenFLUoxetine (PROZAC) 20 MG tablet 1/2 tab po QD x 8 days the increase to whole tab 30 tablet 0   No facility-administered medications prior to visit.     No Known Allergies  ROS Review of Systems    Objective:    Physical Exam  BP (!) 95/57   Pulse 69   Ht 5' (1.524 m)   Wt 195 lb (88.5 kg)   SpO2 100%   BMI 38.08 kg/m  Wt Readings from Last 3 Encounters:  06/10/19 195 lb (88.5 kg)  05/02/19 198 lb (89.8 kg)  04/03/19 192 lb (87.1 kg)     There are no preventive care reminders to display for this patient.  There are no preventive care reminders to display for this patient.  Lab Results  Component Value Date   TSH 0.801 06/27/2011   Lab Results  Component Value Date   WBC 6.5 06/27/2011   HGB 13.5 06/27/2011   HCT 40.1 06/27/2011   MCV 85.9 06/27/2011   PLT 218 06/27/2011   No results found for: NA, K, CHLORIDE, CO2, GLUCOSE, BUN, CREATININE, BILITOT, ALKPHOS, AST, ALT, PROT, ALBUMIN, CALCIUM, ANIONGAP, EGFR, GFR No results found for: CHOL No results found for: HDL No results found for: LDLCALC No results found for: TRIG No results found for: CHOLHDL Lab Results  Component Value Date   HGBA1C 4.4 12/29/2016      Assessment & Plan:   Problem List Items Addressed This Visit      Other   Inattention - Primary    [facility]  The typically done with Dr. ALurline Hare  She says she never heard back from our office and I could not find any specific notes in regards to attempts to contact her.  We will speak with our referral coordinator to see if they can call their office setting get her rescheduled if possible.  Also discussed the importance of getting her mood under better control to better evaluate whether or not she has ADD or not      Bipolar affective disorder, currently depressed, moderate ([place]    Doing  well on Abilify so far.  She is currently taking 4 mg will increase to 5 mg dose that she can take a single tab  daily.  Also continue with fluoxetine.  Plan to follow-up in 1 month.  We will work on getting her in with therapist/counselor.  It looks like she does have an appointment scheduled on the 16th at 10 AM but says she is not gotten any notification about how to connect with video visit so we will try to get that information and get back with her so that she can make her appointment.      Relevant Medications   ARIPiprazole (ABILIFY) 5 MG tablet   FLUoxetine (PROZAC) 20 MG tablet       Meds ordered this encounter  Medications  . ARIPiprazole (ABILIFY) 5 MG tablet    Sig: Take 1 tablet (5 mg total) by mouth daily.    Dispense:  30 tablet    Refill:  0  . FLUoxetine (PROZAC) 20 MG tablet    Sig: Take 1 tablet (20 mg total) by mouth daily.    Dispense:  30 tablet    Refill:  1    Follow-up: Return in about 4 weeks (around 07/08/2019).    Beatrice Lecher, MD

## 2019-06-11 ENCOUNTER — Other Ambulatory Visit: Payer: Self-pay | Admitting: Family Medicine

## 2019-06-11 DIAGNOSIS — R41840 Attention and concentration deficit: Secondary | ICD-10-CM

## 2019-06-13 ENCOUNTER — Ambulatory Visit: Payer: 59 | Admitting: Psychology

## 2019-06-20 ENCOUNTER — Ambulatory Visit: Payer: Self-pay | Admitting: Professional

## 2019-07-14 ENCOUNTER — Other Ambulatory Visit: Payer: Self-pay

## 2019-07-14 ENCOUNTER — Encounter: Payer: Self-pay | Admitting: Family Medicine

## 2019-07-14 ENCOUNTER — Ambulatory Visit (INDEPENDENT_AMBULATORY_CARE_PROVIDER_SITE_OTHER): Payer: 59 | Admitting: Family Medicine

## 2019-07-14 VITALS — BP 122/77 | HR 82 | Ht 60.0 in | Wt 196.0 lb

## 2019-07-14 DIAGNOSIS — R41840 Attention and concentration deficit: Secondary | ICD-10-CM | POA: Diagnosis not present

## 2019-07-14 DIAGNOSIS — F3132 Bipolar disorder, current episode depressed, moderate: Secondary | ICD-10-CM

## 2019-07-14 MED ORDER — ARIPIPRAZOLE 15 MG PO TABS: 15.0000 mg | ORAL_TABLET | Freq: Every day | ORAL | 0 refills | Status: DC

## 2019-07-14 NOTE — Assessment & Plan Note (Signed)
Overall she is significantly improved.  PHQ-9 score is 9 today which is down from 23.  GAD-7 score 1 today down from previous of 18.  She is actually taking 15 mg of Abilify so new prescription sent for the 50 mg tabs that she does not have to take 3 of the fives.  Continue with fluoxetine 20 as well and I will see her back at the end of January.

## 2019-07-14 NOTE — Assessment & Plan Note (Signed)
The next time I see her she should have had her evaluation for possible ADD and we can go over those results after that.

## 2019-07-14 NOTE — Progress Notes (Signed)
Established Patient Office Visit  Subjective:  Patient ID: Shirley Olson, female    DOB: 05-24-1996  Age: 23 y.o. MRN: [id number]  CC:  Chief Complaint  Patient presents with  . mood    HPI Shirley Olson presents for she says she increased her Abilify on her own to 3 tabs daily along with her fluoxetine but feels like that regimen is actually working really well for her she feels like she is much less irritable she is getting along with her family a little bit better.  She still working a lot and works third shift and takes care of her child so she has been a little tired.  Sleep is fair.  She says she never had the appointment with a therapist that was scheduled.  He says no one ever called her on the day of her appointment.  She did get an appointment in December with the ADD specialist for evaluation and they scheduled her for a follow-up appointment in January.  Past Medical History:  Diagnosis Date  . Arm fracture, left    distal radius, buckle fracture  . Arm fracture, right 2009 for a   Distal radius    Past Surgical History:  Procedure Laterality Date  . TONSILLECTOMY    . TONSILLECTOMY AND ADENOIDECTOMY  08/2006    Family History  Problem Relation Age of Onset  . Depression Unknown   . Diabetes Unknown     Social History   Socioeconomic History  . Marital status: Single    Spouse name: Not on file  . Number of children: Not on file  . Years of education: Not on file  . Highest education level: Not on file  Occupational History  . Occupation: Ship broker  Social Needs  . Financial resource strain: Not on file  . Food insecurity    Worry: Not on file    Inability: Not on file  . Transportation needs    Medical: Not on file    Non-medical: Not on file  Tobacco Use  . Smoking status: Never Smoker  . Smokeless tobacco: Never Used  Substance and Sexual Activity  . Alcohol use: No  . Drug use: No  . Sexual activity: Never    Comment: Lives with both  parents, no sports, attends school.  Lifestyle  . Physical activity    Days per week: Not on file    Minutes per session: Not on file  . Stress: Not on file  Relationships  . Social Herbalist on phone: Not on file    Gets together: Not on file    Attends religious service: Not on file    Active member of club or organization: Not on file    Attends meetings of clubs or organizations: Not on file    Relationship status: Not on file  . Intimate partner violence    Fear of current or ex partner: Not on file    Emotionally abused: Not on file    Physically abused: Not on file    Forced sexual activity: Not on file  Other Topics Concern  . Not on file  Social History Narrative    No sports.      Outpatient Medications Prior to Visit  Medication Sig Dispense Refill  . FLUoxetine (PROZAC) 20 MG tablet Take 1 tablet (20 mg total) by mouth daily. 30 tablet 1  . gabapentin (NEURONTIN) 300 MG capsule One tab PO qHS for a week, then BID for a week,  then TID. May double weekly to a max of 3,684m/day 180 capsule 3  . XULANE 150-35 MCG/24HR transdermal patch Apply 1 patch(es) every week by transdermal route.  4  . ARIPiprazole (ABILIFY) 5 MG tablet Take 1 tablet (5 mg total) by mouth daily. 30 tablet 0   No facility-administered medications prior to visit.     No Known Allergies  ROS Review of Systems    Objective:    Physical Exam  Constitutional: She is oriented to person, place, and time. She appears well-developed and well-nourished.  HENT:  Head: Normocephalic and atraumatic.  Cardiovascular: Normal rate, regular rhythm and normal heart sounds.  Pulmonary/Chest: Effort normal and breath sounds normal.  Neurological: She is alert and oriented to person, place, and time.  Skin: Skin is warm and dry.  Psychiatric: She has a normal mood and affect. Her behavior is normal.    BP 122/77   Pulse 82   Ht 5' (1.524 m)   Wt 196 lb (88.9 kg)   SpO2 99%   BMI 38.28  kg/m  Wt Readings from Last 3 Encounters:  07/14/19 196 lb (88.9 kg)  06/10/19 195 lb (88.5 kg)  05/02/19 198 lb (89.8 kg)     Health Maintenance Due  Topic Date Due  . HIV Screening  08/29/2011    There are no preventive care reminders to display for this patient.  Lab Results  Component Value Date   TSH 0.801 06/27/2011   Lab Results  Component Value Date   WBC 6.5 06/27/2011   HGB 13.5 06/27/2011   HCT 40.1 06/27/2011   MCV 85.9 06/27/2011   PLT 218 06/27/2011   No results found for: NA, K, CHLORIDE, CO2, GLUCOSE, BUN, CREATININE, BILITOT, ALKPHOS, AST, ALT, PROT, ALBUMIN, CALCIUM, ANIONGAP, EGFR, GFR No results found for: CHOL No results found for: HDL No results found for: LDLCALC No results found for: TRIG No results found for: CHOLHDL Lab Results  Component Value Date   HGBA1C 4.4 12/29/2016      Assessment & Plan:   Problem List Items Addressed This Visit      Other   Inattention    The next time I see her she should have had her evaluation for possible ADD and we can go over those results after that.      Bipolar affective disorder, currently depressed, moderate ([place] - Primary    Overall she is significantly improved.  PHQ-9 score is 9 today which is down from 23.  GAD-7 score 1 today down from previous of 18.  She is actually taking 15 mg of Abilify so new prescription sent for the 50 mg tabs that she does not have to take 3 of the fives.  Continue with fluoxetine 20 as well and I will see her back at the end of January.      Relevant Medications   ARIPiprazole (ABILIFY) 15 MG tablet      Meds ordered this encounter  Medications  . ARIPiprazole (ABILIFY) 15 MG tablet    Sig: Take 1 tablet (15 mg total) by mouth daily.    Dispense:  90 tablet    Refill:  0    Follow-up: Return in about 10 weeks (around 09/22/2019) for Mood/medication .    CBeatrice Lecher MD

## 2019-08-07 ENCOUNTER — Ambulatory Visit (INDEPENDENT_AMBULATORY_CARE_PROVIDER_SITE_OTHER): Payer: 59 | Admitting: Psychology

## 2019-08-07 DIAGNOSIS — F909 Attention-deficit hyperactivity disorder, unspecified type: Secondary | ICD-10-CM | POA: Diagnosis not present

## 2019-08-07 DIAGNOSIS — F319 Bipolar disorder, unspecified: Secondary | ICD-10-CM

## 2019-08-27 ENCOUNTER — Encounter: Payer: Self-pay | Admitting: Family Medicine

## 2019-08-27 ENCOUNTER — Other Ambulatory Visit: Payer: Self-pay

## 2019-08-27 ENCOUNTER — Ambulatory Visit (INDEPENDENT_AMBULATORY_CARE_PROVIDER_SITE_OTHER): Payer: 59 | Admitting: Family Medicine

## 2019-08-27 VITALS — BP 93/51 | HR 85 | Ht 60.0 in

## 2019-08-27 DIAGNOSIS — R102 Pelvic and perineal pain: Secondary | ICD-10-CM | POA: Diagnosis not present

## 2019-08-27 DIAGNOSIS — Z331 Pregnant state, incidental: Secondary | ICD-10-CM

## 2019-08-27 DIAGNOSIS — F3132 Bipolar disorder, current episode depressed, moderate: Secondary | ICD-10-CM

## 2019-08-27 LAB — POCT URINALYSIS DIP (CLINITEK)
Bilirubin, UA: NEGATIVE
Blood, UA: NEGATIVE
Glucose, UA: NEGATIVE mg/dL
Ketones, POC UA: NEGATIVE mg/dL
Nitrite, UA: NEGATIVE
POC PROTEIN,UA: NEGATIVE
Spec Grav, UA: 1.025 (ref 1.010–1.025)
Urobilinogen, UA: 0.2 E.U./dL
pH, UA: 6 (ref 5.0–8.0)

## 2019-08-27 LAB — POCT URINE PREGNANCY: Preg Test, Ur: POSITIVE — AB

## 2019-08-27 NOTE — Patient Instructions (Signed)
Since you have already discontinued your Abilify and fluoxetine okay to continue to hold these medications.  But if at any point you feel like your mood is fluctuating then we may need to restart them at a lower dose.  Considered moderately safe for the first and second trimesters of pregnancy.  But again just let me know.

## 2019-08-27 NOTE — Progress Notes (Signed)
Acute Office Visit  Subjective:    Patient ID: Shirley Olson, female    DOB: 03/25/1996, 23 y.o.   MRN: [id number]  Chief Complaint  Patient presents with  . positive home pregnancy test     HPI Patient is in today for possible pregnancy.  She says the first day of her last period was July 26, 2019.  She actually had 2 periods that month and says they were fairly short around 3 days but that is not atypical for her.  Today she has had a little pelvic pressure and pain but no spotting or bleeding.  She is coming in today to confirm the pregnancy.  She called [facility] which is where she had her daughter and they recommended that she come in to confirm the pregnancy first.  She is also on fluoxetine and Abilify.  She did stop taking her medications as soon as she had a positive pregnancy test.  Past Medical History:  Diagnosis Date  . Arm fracture, left    distal radius, buckle fracture  . Arm fracture, right 2009 for a   Distal radius    Past Surgical History:  Procedure Laterality Date  . TONSILLECTOMY    . TONSILLECTOMY AND ADENOIDECTOMY  08/2006    Family History  Problem Relation Age of Onset  . Depression Unknown   . Diabetes Unknown     Social History   Socioeconomic History  . Marital status: Single    Spouse name: Not on file  . Number of children: Not on file  . Years of education: Not on file  . Highest education level: Not on file  Occupational History  . Occupation: Ship broker  Tobacco Use  . Smoking status: Never Smoker  . Smokeless tobacco: Never Used  Substance and Sexual Activity  . Alcohol use: No  . Drug use: No  . Sexual activity: Never    Comment: Lives with both parents, no sports, attends school.  Other Topics Concern  . Not on file  Social History Narrative    No sports.     Social Determinants of Health   Financial Resource Strain:   . Difficulty of Paying Living Expenses: Not on file  Food Insecurity:   . Worried About  Charity fundraiser in the Last Year: Not on file  . Ran Out of Food in the Last Year: Not on file  Transportation Needs:   . Lack of Transportation (Medical): Not on file  . Lack of Transportation (Non-Medical): Not on file  Physical Activity:   . Days of Exercise per Week: Not on file  . Minutes of Exercise per Session: Not on file  Stress:   . Feeling of Stress : Not on file  Social Connections:   . Frequency of Communication with Friends and Family: Not on file  . Frequency of Social Gatherings with Friends and Family: Not on file  . Attends Religious Services: Not on file  . Active Member of Clubs or Organizations: Not on file  . Attends Archivist Meetings: Not on file  . Marital Status: Not on file  Intimate Partner Violence:   . Fear of Current or Ex-Partner: Not on file  . Emotionally Abused: Not on file  . Physically Abused: Not on file  . Sexually Abused: Not on file    Outpatient Medications Prior to Visit  Medication Sig Dispense Refill  . ARIPiprazole (ABILIFY) 15 MG tablet Take 1 tablet (15 mg total) by mouth daily. [place]  tablet 0  . FLUoxetine (PROZAC) 20 MG tablet Take 1 tablet (20 mg total) by mouth daily. 30 tablet 1  . gabapentin (NEURONTIN) 300 MG capsule One tab PO qHS for a week, then BID for a week, then TID. May double weekly to a max of 3,616m/day 180 capsule 3  . XULANE 150-35 MCG/24HR transdermal patch Apply 1 patch(es) every week by transdermal route.  4   No facility-administered medications prior to visit.    No Known Allergies  Review of Systems     Objective:    Physical Exam Constitutional:      Appearance: She is well-developed.  HENT:     Head: Normocephalic and atraumatic.  Cardiovascular:     Rate and Rhythm: Normal rate and regular rhythm.     Heart sounds: Normal heart sounds.  Pulmonary:     Effort: Pulmonary effort is normal.     Breath sounds: Normal breath sounds.  Skin:    General: Skin is warm and dry.   Neurological:     Mental Status: She is alert and oriented to person, place, and time.  Psychiatric:        Behavior: Behavior normal.     BP (!) 93/51   Pulse 85   Ht 5' (1.524 m)   LMP 07/17/2019 (Approximate)   SpO2 100%   BMI 38.28 kg/m  Wt Readings from Last 3 Encounters:  07/14/19 196 lb (88.9 kg)  06/10/19 195 lb (88.5 kg)  05/02/19 198 lb (89.8 kg)    Health Maintenance Due  Topic Date Due  . HIV Screening  08/29/2011    There are no preventive care reminders to display for this patient.   Lab Results  Component Value Date   TSH 0.801 06/27/2011   Lab Results  Component Value Date   WBC 6.5 06/27/2011   HGB 13.5 06/27/2011   HCT 40.1 06/27/2011   MCV 85.9 06/27/2011   PLT 218 06/27/2011   No results found for: NA, K, CHLORIDE, CO2, GLUCOSE, BUN, CREATININE, BILITOT, ALKPHOS, AST, ALT, PROT, ALBUMIN, CALCIUM, ANIONGAP, EGFR, GFR No results found for: CHOL No results found for: HDL No results found for: LDLCALC No results found for: TRIG No results found for: CHOLHDL Lab Results  Component Value Date   HGBA1C 4.4 12/29/2016       Assessment & Plan:   Problem List Items Addressed This Visit      Other   Bipolar affective disorder, currently depressed, moderate ([facility]    Other Visit Diagnoses    Incidental pregnancy    -  Primary   Relevant Orders   POCT urine pregnancy (Completed)   B-HCG Quant   Pelvic pressure in female       Relevant Orders   POCT URINALYSIS DIP (CLINITEK) (Completed)     Pregnancy-urine pregnancy test was positive today.  We will get hCG quant to confirm and make sure that it matches with her dates.  She would be approximately 4 weeks and 4 days today with an estimated due date of May 01, 2020.  Has let the father know and he seems supportive.  [facility]  Discuss starting a prenatal vitamin and avoiding processed foods as well as nitrites.  Continuing with regular  exercise.  Bipolar disorder-she already stopped her Abilify about a week ago.  Did confirm that there is caution advised in the third trimester but should be relatively safe for the first and second trimester.  She is also already stopped her fluoxetine.  We could just monitor her during the pregnancy but if at any point we need to restart these they would be considered moderately safe for the first and second trimesters if needed.  Pelvic pressure. UA only showing leuks but not a clean-catch.  If symptoms persist then please let us know and we will recollect a clean-catch specimen.  No orders of the defined types were placed in this encounter.    Beatrice Lecher, MD

## 2019-08-28 LAB — HCG, QUANTITATIVE, PREGNANCY: HCG, Total, QN: 11298 m[IU]/mL

## 2019-09-03 ENCOUNTER — Ambulatory Visit: Payer: 59 | Admitting: Psychology

## 2019-09-04 DIAGNOSIS — F330 Major depressive disorder, recurrent, mild: Secondary | ICD-10-CM

## 2019-09-04 DIAGNOSIS — R4183 Borderline intellectual functioning: Secondary | ICD-10-CM

## 2019-09-22 ENCOUNTER — Encounter: Payer: Self-pay | Admitting: Family Medicine

## 2019-09-22 ENCOUNTER — Ambulatory Visit (INDEPENDENT_AMBULATORY_CARE_PROVIDER_SITE_OTHER): Payer: 59 | Admitting: Family Medicine

## 2019-09-22 VITALS — Ht 60.0 in

## 2019-09-22 DIAGNOSIS — Z331 Pregnant state, incidental: Secondary | ICD-10-CM

## 2019-09-22 DIAGNOSIS — F331 Major depressive disorder, recurrent, moderate: Secondary | ICD-10-CM | POA: Diagnosis not present

## 2019-09-22 DIAGNOSIS — R4183 Borderline intellectual functioning: Secondary | ICD-10-CM | POA: Diagnosis not present

## 2019-09-22 DIAGNOSIS — R41840 Attention and concentration deficit: Secondary | ICD-10-CM

## 2019-09-22 NOTE — Progress Notes (Signed)
LVM informing pt that I was calling to do her to do her prescreening prior to her appt w/Dr. Linford Arnold.

## 2019-09-22 NOTE — Assessment & Plan Note (Signed)
Discussed that based on Evaluation with [place] she likely doesn't have Bipolar.  Has MDD with borderline intellectual ability.  So we did discuss that for right now she is not on anything and doing really well but after she delivers the baby if we need to based on symptoms we can always restart medication and we could always start with fluoxetine by itself and maximize the dose before adding any type of mood stabilizer.

## 2019-09-22 NOTE — Progress Notes (Signed)
Virtual Visit via Telephone Note  I connected with Shirley Olson on 09/22/19 at  2:20 PM EST by a video enabled telemedicine application and verified that I am speaking with the correct person using two identifiers.   I discussed the limitations of evaluation and management by telemedicine and the availability of in person appointments. The patient expressed understanding and agreed to proceed.  Subjective:    CC: Mood  HPI: 24 year old female is here today to follow-up for mood.  She recently found out she was pregnant about 5 weeks ago.  She had actually on her own stopped her medication including her Abilify and her fluoxetine.  Today is to follow-up to make sure that she is doing well.  Since I last saw her she did have psychological testing done with [facility].  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  Executive functioning was very low as well.  They did note that she responded with exceptionally fast rate during the test which they think may have accounted for very high number of errors.  She did not quite meet criteria for ADHD.  Even though she did have a prior diagnosis of bipolar disorder testing indicated that her mood disorder was most consistent with depression, recurrent as well as borderline intelligence.  It was recommended to consider medication after pregnancy if needed as well as psychotherapy and developing a visual organization system as well as seeking social support as needed.  She is feeling well with the pregnancy thus far and still taking a prenatal vitamin.  Sleeping well and denies any significant symptoms of depression or anxiety currently.  Past medical history, Surgical history, Family history not pertinant except as noted below, Social history, Allergies, and medications have been entered into the medical record, reviewed, and corrections made.   Review of  Systems: No fevers, chills, night sweats, weight loss, chest pain, or shortness of breath.   Objective:    General: Speaking clearly in complete sentences without any shortness of breath.  Alert and oriented x3.  Normal judgment. No apparent acute distress.    Impression and Recommendations:    MDD -discussed change of diagnosis.  And never had any formal paperwork saying that she had bipolar disorder but this is what she was told me along with her mother.  After reading the evaluation based on what I have seen clinically have never seen any mania or hypomania symptoms I do suspect that she just has MDD.  Again we can address further after the pregnancy if she needs to be on medication again.  Attention-see note above does not have ADHD.  Pregnancy-feeling well and currently on a prenatal vitamin has her first consultation with OB/GYN this Wednesday at 8 AM.  MDD (major depressive disorder), recurrent episode [facility]) Discussed that based on Evaluation with [place] she likely doesn't have Bipolar.  Has MDD with borderline intellectual ability.  So we did discuss that for right now she is not on anything and doing really well but after she delivers the baby if we need to based on symptoms we can always restart medication and we could always start with fluoxetine by itself and maximize the dose before adding any type of mood stabilizer.  Inattention Evaluated for ADD/ADHD.  She did have some the symptoms but did not quite meet all the criteria to be diagnosed and medicated for ADD.  She certainly would benefit though from some organizational skills and working on things  in small pieces and processing information with small amounts at a time.  Time spent 25 mintues nonface-to-face encounter,, including reviewing her psychological evaluation.  Please see scanned document in chart..   I discussed the assessment and treatment plan with the patient. The patient was provided an opportunity to ask  questions and all were answered. The patient agreed with the plan and demonstrated an understanding of the instructions.   The patient was advised to call back or seek an in-person evaluation if the symptoms worsen or if the condition fails to improve as anticipated.   Beatrice Lecher, MD

## 2019-09-22 NOTE — Assessment & Plan Note (Signed)
Evaluated for ADD/ADHD.  She did have some the symptoms but did not quite meet all the criteria to be diagnosed and medicated for ADD.  She certainly would benefit though from some organizational skills and working on things in small pieces and processing information with small amounts at a time.

## 2019-09-29 ENCOUNTER — Emergency Department (INDEPENDENT_AMBULATORY_CARE_PROVIDER_SITE_OTHER)
Admission: EM | Admit: 2019-09-29 | Discharge: 2019-09-29 | Disposition: A | Discharge status: Home / Self Care | Payer: 59 | Source: Home / Self Care

## 2019-09-29 ENCOUNTER — Other Ambulatory Visit: Payer: Self-pay

## 2019-09-29 DIAGNOSIS — L02219 Cutaneous abscess of trunk, unspecified: Secondary | ICD-10-CM

## 2019-09-29 DIAGNOSIS — L03319 Cellulitis of trunk, unspecified: Secondary | ICD-10-CM | POA: Diagnosis not present

## 2019-09-29 DIAGNOSIS — Z3A10 10 weeks gestation of pregnancy: Secondary | ICD-10-CM

## 2019-09-29 MED ORDER — MUPIROCIN 2 % EX OINT: 1.0000 "application " | TOPICAL_OINTMENT | Freq: Three times a day (TID) | CUTANEOUS | 0 refills | Status: DC

## 2019-09-29 MED ORDER — CEPHALEXIN 500 MG PO CAPS: 1000.0000 mg | ORAL_CAPSULE | Freq: Two times a day (BID) | ORAL | 0 refills | Status: AC

## 2019-09-29 NOTE — ED Triage Notes (Signed)
Pt has 2 red bumps on each side of chest where bra lays that has been there since about Thursday of last week.  Has not used any medication on them, denies drainage.

## 2019-09-29 NOTE — Discharge Instructions (Signed)
Return for here for evaluation if abscess began to drain or if they become more enlarged.  Check every daily to ensure area of redness is not expanding.  Complete all medication as prescribed.

## 2019-09-29 NOTE — ED Provider Notes (Signed)
[facility]    CSN: [id number] Arrival date & time: 09/29/19  1146      History   Chief Complaint Chief Complaint  Patient presents with  . Abscess    HPI Shirley Olson is a 24 y.o. female.   HPI  Patient is presents today for evaluation of bilateral torso abscess-like lesions.  In review of EMR patient has had abscess lesions in the same location previously.  There is surrounding erythema around both areas.  Patient denies wearing any ill or tight fitting clothing that has potentially irritated the affected area.  Patient is currently [redacted] weeks pregnant.  Denies any fever, chills, new nausea or vomiting.  Past Medical History:  Diagnosis Date  . Arm fracture, left    distal radius, buckle fracture  . Arm fracture, right 2009 for a   Distal radius    Patient Active Problem List   Diagnosis Date Noted  . Inattention 06/10/2019  . Right foot pain 05/02/2019  . MDD (major depressive disorder), recurrent episode ([facility]) 08/09/2018  . Obesity (BMI 30-39.9) 01/22/2018  . Post-dates pregnancy 01/19/2018  . Malpresentation before onset of labor 01/03/2018  . Migraine without aura and with status migrainosus, not intractable 07/03/2016  . Adjustment disorder of adolescence 08/18/2011    Class: Acute  . Elevated liver enzymes 05/03/2011    Past Surgical History:  Procedure Laterality Date  . TONSILLECTOMY    . TONSILLECTOMY AND ADENOIDECTOMY  08/2006    OB History    Gravida  1   Para      Term      Preterm      AB      Living        SAB      TAB      Ectopic      Multiple      Live Births               Home Medications    Prior to Admission medications   Medication Sig Start Date End Date Taking? Authorizing Provider  Prenatal Vit-Fe Fumarate-FA (PRENATAL MULTIVITAMIN) TABS tablet Take 1 tablet by mouth daily at 12 noon.    [provider]    Family History Family History  Problem Relation Age of Onset  . Depression  Other   . Diabetes Other     Social History Social History   Tobacco Use  . Smoking status: Never Smoker  . Smokeless tobacco: Never Used  Substance Use Topics  . Alcohol use: No  . Drug use: No     Allergies   Patient has no known allergies.   Review of Systems Review of Systems Pertinent negatives listed in HPI  Physical Exam Triage Vital Signs ED Triage Vitals  Enc Vitals Group     BP 09/29/19 1211 119/71     Pulse Rate 09/29/19 1211 80     Resp 09/29/19 1211 20     Temp 09/29/19 1211 98.1 F (36.7 C)     Temp Source 09/29/19 1211 Oral     SpO2 09/29/19 1211 100 %     Weight 09/29/19 1212 191 lb (86.6 kg)     Height 09/29/19 1212 5' (1.524 m)     Head Circumference --      Peak Flow --      Pain Score 09/29/19 1212 10     Pain Loc --      Pain Edu? --      Excl. in GC? --  No data found.  Updated Vital Signs BP 119/71 (BP Location: Right Arm)   Pulse 80   Temp 98.1 F (36.7 C) (Oral)   Resp 20   Ht 5' (1.524 m)   Wt 191 lb (86.6 kg)   LMP 07/26/2019   SpO2 100%   BMI 37.30 kg/m   Visual Acuity Right Eye Distance:   Left Eye Distance:   Bilateral Distance:    Right Eye Near:   Left Eye Near:    Bilateral Near:     Physical Exam Constitutional:      Appearance: Normal appearance.  Cardiovascular:     Rate and Rhythm: Normal rate and regular rhythm.     Pulses: Normal pulses.  Pulmonary:     Effort: Pulmonary effort is normal.     Breath sounds: Normal breath sounds.  Chest:    Musculoskeletal:        General: Normal range of motion.  Skin:    Findings: Erythema and lesion present.  Neurological:     Mental Status: She is alert. Mental status is at baseline.  Psychiatric:        Mood and Affect: Mood normal.      UC Treatments / Results  Labs (all labs ordered are listed, but only abnormal results are displayed) Labs Reviewed - No data to display  EKG   Radiology No results found.  Procedures Procedures  (including critical care time)  Medications Ordered in UC Medications - No data to display  Initial Impression / Assessment and Plan / UC Course  I have reviewed the triage vital signs and the nursing notes.  Pertinent labs & imaging results that were available during my care of the patient were reviewed by me and considered in my medical decision making (see chart for details).     1. Cellulitis and abscess of trunk, bilateral   2. [redacted] weeks gestation of pregnancy    I&D not indicated today given abscess minimally indurated and non-fluctuant.  Treating empirically for cellulitis with Keflex 1000 mg twice daily x10 days.  Patient is [redacted] weeks pregnant therefore Keflex is a reasonable safe choice for pregnancy.  Also will prescribe topical Bactroban as patient complains of some localized discomfort and has some mild chafing surrounding the area of concern.  Patient advised to vigilantly monitor these areas on a daily basis and to follow-up immediately if redness expands beyond the current affected area or if abscess becomes more enlarged.  Patient verbalized understanding and agreement with plan. Final Clinical Impressions(s) / UC Diagnoses   Final diagnoses:  Cellulitis and abscess of trunk, bilateral     Discharge Instructions     Return for here for evaluation if abscess began to drain or if they become more enlarged.  Check every daily to ensure area of redness is not expanding.  Complete all medication as prescribed.    ED Prescriptions    Medication Sig Dispense Auth. Provider   cephALEXin (KEFLEX) 500 MG capsule Take 2 capsules (1,000 mg total) by mouth 2 (two) times daily for 10 days. 40 capsule Scot Jun, FNP   mupirocin ointment (BACTROBAN) 2 % Apply 1 application topically 3 (three) times daily. 30 g Scot Jun, FNP     PDMP not reviewed this encounter.   Scot Jun, FNP 09/29/19 1413

## 2019-10-26 ENCOUNTER — Encounter: Payer: Self-pay | Admitting: Emergency Medicine

## 2019-10-26 ENCOUNTER — Other Ambulatory Visit: Payer: Self-pay

## 2019-10-26 ENCOUNTER — Emergency Department (INDEPENDENT_AMBULATORY_CARE_PROVIDER_SITE_OTHER)
Admission: EM | Admit: 2019-10-26 | Discharge: 2019-10-26 | Disposition: A | Discharge status: Home / Self Care | Payer: 59 | Source: Home / Self Care | Attending: Family Medicine | Admitting: Family Medicine

## 2019-10-26 DIAGNOSIS — K219 Gastro-esophageal reflux disease without esophagitis: Secondary | ICD-10-CM | POA: Diagnosis not present

## 2019-10-26 DIAGNOSIS — R8290 Unspecified abnormal findings in urine: Secondary | ICD-10-CM

## 2019-10-26 LAB — POCT URINALYSIS DIP (MANUAL ENTRY)
Bilirubin, UA: NEGATIVE
Blood, UA: NEGATIVE
Glucose, UA: NEGATIVE mg/dL
Ketones, POC UA: NEGATIVE mg/dL
Nitrite, UA: NEGATIVE
Protein Ur, POC: NEGATIVE mg/dL
Spec Grav, UA: 1.02 (ref 1.010–1.025)
Urobilinogen, UA: 0.2 E.U./dL
pH, UA: 8.5 — AB (ref 5.0–8.0)

## 2019-10-26 MED ORDER — FAMOTIDINE 20 MG PO TABS: 20.0000 mg | ORAL_TABLET | Freq: Two times a day (BID) | ORAL | 0 refills | Status: DC

## 2019-10-26 NOTE — ED Provider Notes (Signed)
[facility]    CSN: [id number] Arrival date & time: 10/26/19  9678      History   Chief Complaint Chief Complaint  Patient presents with  . Abdominal Pain    pregnant [redacted] weeks    HPI Shirley Olson is a 24 y.o. female.   Patient is now at [redacted] weeks gestation (P1,G2) with no previous problems during this pregnancy, other than random episodes of nausea/vomiting.  Last night about 6pm she had a typical episode of nausea/vomiting that resolved spontaneously.  This morning at about 5:30am she vomited once, subsequently developing stabbing upper abdominal pain that has persisted.  The pain does not radiate and she feels well otherwise.  She denies lower abdominal pain, vaginal bleeding, urinary symptoms, and fevers, chills, and sweats.  Her bowel movements have been normal.  She denies history of GERD and biliary colic.  The history is provided by the patient.  Abdominal Pain Pain location:  Epigastric Pain quality: stabbing   Pain radiates to:  Does not radiate Pain severity:  Mild Onset quality:  Sudden Duration:  4 hours Timing:  Intermittent Progression:  Improving Chronicity:  New Context: awakening from sleep   Context: not diet changes, not eating and not suspicious food intake   Relieved by:  None tried Worsened by:  Nothing Ineffective treatments:  None tried Associated symptoms: anorexia, nausea and vomiting   Associated symptoms: no belching, no chest pain, no chills, no constipation, no cough, no diarrhea, no dysuria, no fatigue, no fever, no flatus, no hematemesis, no hematochezia, no hematuria, no melena, no shortness of breath, no vaginal bleeding and no vaginal discharge   Risk factors: pregnancy     Past Medical History:  Diagnosis Date  . Arm fracture, left    distal radius, buckle fracture  . Arm fracture, right 2009 for a   Distal radius    Patient Active Problem List   Diagnosis Date Noted  . Inattention 06/10/2019  . Right foot pain  05/02/2019  . MDD (major depressive disorder), recurrent episode (HCC) 08/09/2018  . Obesity (BMI 30-39.9) 01/22/2018  . Post-dates pregnancy 01/19/2018  . Malpresentation before onset of labor 01/03/2018  . Migraine without aura and with status migrainosus, not intractable 07/03/2016  . Adjustment disorder of adolescence 08/18/2011    Class: Acute  . Elevated liver enzymes 05/03/2011    Past Surgical History:  Procedure Laterality Date  . TONSILLECTOMY    . TONSILLECTOMY AND ADENOIDECTOMY  08/2006    OB History    Gravida  1   Para      Term      Preterm      AB      Living        SAB      TAB      Ectopic      Multiple      Live Births               Home Medications    Prior to Admission medications   Medication Sig Start Date End Date Taking? Authorizing Provider  famotidine (PEPCID) 20 MG tablet Take 1 tablet (20 mg total) by mouth 2 (two) times daily for 15 days. 10/26/19 11/10/19  Lattie Haw, MD  mupirocin ointment (BACTROBAN) 2 % Apply 1 application topically 3 (three) times daily. 09/29/19   Bing Neighbors, FNP  Prenatal Vit-Fe Fumarate-FA (PRENATAL MULTIVITAMIN) TABS tablet Take 1 tablet by mouth daily at 12 noon.    [provider]    Family History Family History  Problem Relation Age of Onset  . Depression Other   . Diabetes Other     Social History Social History   Tobacco Use  . Smoking status: Never Smoker  . Smokeless tobacco: Never Used  Substance Use Topics  . Alcohol use: No  . Drug use: No     Allergies   Patient has no known allergies.   Review of Systems Review of Systems  Constitutional: Positive for appetite change. Negative for chills, diaphoresis, fatigue and fever.  HENT: Negative.   Eyes: Negative.   Respiratory: Negative for cough, chest tightness, shortness of breath and wheezing.   Cardiovascular: Negative for chest pain and palpitations.  Gastrointestinal: Positive for abdominal pain,  anorexia, nausea and vomiting. Negative for abdominal distention, blood in stool, constipation, diarrhea, flatus, hematemesis, hematochezia and melena.  Genitourinary: Negative for dysuria, frequency, hematuria, pelvic pain, urgency, vaginal bleeding and vaginal discharge.  Musculoskeletal: Negative.   Skin: Negative.   Neurological: Negative for headaches.     Physical Exam Triage Vital Signs ED Triage Vitals  Enc Vitals Group     BP 10/26/19 0940 102/64     Pulse Rate 10/26/19 0940 71     Resp 10/26/19 0940 16     Temp 10/26/19 0940 98.1 F (36.7 C)     Temp Source 10/26/19 0940 Oral     SpO2 10/26/19 0940 100 %     Weight 10/26/19 0941 189 lb 9.5 oz (86 kg)     Height 10/26/19 0941 5' (1.524 m)     Head Circumference --      Peak Flow --      Pain Score 10/26/19 0941 0     Pain Loc --      Pain Edu? --      Excl. in [place]? --    No data found.  Updated Vital Signs BP 102/64 (BP Location: Right Wrist)   Pulse 71   Temp 98.1 F (36.7 C) (Oral)   Resp 16   Ht 5' (1.524 m)   Wt 86 kg   LMP 07/26/2019   SpO2 100%   BMI 37.03 kg/m   Visual Acuity Right Eye Distance:   Left Eye Distance:   Bilateral Distance:    Right Eye Near:   Left Eye Near:    Bilateral Near:     Physical Exam Vitals and nursing note reviewed.  Constitutional:      General: She is not in acute distress.    Appearance: She is not ill-appearing.  HENT:     Head: Normocephalic.     Nose: Nose normal.     Mouth/Throat:     Mouth: Mucous membranes are moist.  Eyes:     Pupils: Pupils are equal, round, and reactive to light.  Cardiovascular:     Rate and Rhythm: Normal rate and regular rhythm.     Heart sounds: Normal heart sounds.  Pulmonary:     Breath sounds: Normal breath sounds.  Abdominal:     General: Abdomen is protuberant. Bowel sounds are normal.     Palpations: Abdomen is soft. There is no hepatomegaly or splenomegaly.     Tenderness: There is abdominal tenderness in the  epigastric area. There is no right CVA tenderness, left CVA tenderness or guarding.       Comments: Patient has mild sub-xiphoid epigastric tenderness to palpation as noted on diagram.   Musculoskeletal:     Right lower leg: No  edema.     Left lower leg: No edema.  Lymphadenopathy:     Cervical: No cervical adenopathy.  Skin:    General: Skin is warm and dry.  Neurological:     Mental Status: She is alert.      UC Treatments / Results  Labs (all labs ordered are listed, but only abnormal results are displayed) Labs Reviewed  POCT URINALYSIS DIP (MANUAL ENTRY) - Abnormal; Notable for the following components:      Result Value   pH, UA 8.5 (*)    Leukocytes, UA Small (1+) (*)    All other components within normal limits  URINE CULTURE    EKG   Radiology No results found.  Procedures Procedures (including critical care time)  Medications Ordered in UC Medications - No data to display  Initial Impression / Assessment and Plan / UC Course  I have reviewed the triage vital signs and the nursing notes.  Pertinent labs & imaging results that were available during my care of the patient were reviewed by me and considered in my medical decision making (see chart for details).    Patient has benign exam.  Begin trial of Pepcid 20mg  BID (#30, no refill). Note small leukocytes on urinalysis; will send culture. Follow up with OB in about 2 weeks as scheduled.   Final Clinical Impressions(s) / UC Diagnoses   Final diagnoses:  Abnormal finding on urinalysis  Gastroesophageal reflux disease, unspecified whether esophagitis present     Discharge Instructions     If symptoms become significantly worse during the night or over the weekend, proceed to the local emergency room.     ED Prescriptions    Medication Sig Dispense Auth. Provider   famotidine (PEPCID) 20 MG tablet Take 1 tablet (20 mg total) by mouth 2 (two) times daily for 15 days. 30 tablet ,  MD        Lattie Haw, MD 10/26/19 1046

## 2019-10-26 NOTE — ED Triage Notes (Signed)
Patient here at [redacted] weeks gestation; p 1/ gr 2; no problems with first full term pregnancy/delivery; started having epigastric pain last evening which radiated midline top to bottom; with movement changed to side to side pain; no vaginal bleeding; no nausea/vomiting/diarrhea/dysuria. No current distress. Has had influenza vacc this season. No known exposure to covid positive person.

## 2019-10-26 NOTE — Discharge Instructions (Addendum)
If symptoms become significantly worse during the night or over the weekend, proceed to the local emergency room.  

## 2019-10-27 LAB — URINE CULTURE
MICRO NUMBER:: 10198306
SPECIMEN QUALITY:: ADEQUATE

## 2019-10-28 ENCOUNTER — Encounter (HOSPITAL_COMMUNITY): Payer: Self-pay

## 2019-10-28 ENCOUNTER — Telehealth (HOSPITAL_COMMUNITY): Payer: Self-pay | Admitting: Emergency Medicine

## 2019-10-28 MED ORDER — AMOXICILLIN 500 MG PO CAPS: 500.0000 mg | ORAL_CAPSULE | Freq: Two times a day (BID) | ORAL | 0 refills | Status: AC

## 2019-10-28 NOTE — Telephone Encounter (Signed)
Urine culture needs treatment. Per Dr. Delton See, send amoxicillin 500 BID x5 days to pt pharmacy. Attempted to reach patient. No answer at this time. Voicemail left.   If you have any questions, you may call me at [phone]

## 2019-10-30 ENCOUNTER — Telehealth (HOSPITAL_COMMUNITY): Payer: Self-pay | Admitting: Emergency Medicine

## 2019-10-30 NOTE — Telephone Encounter (Signed)
Attempted to reach patient x2. No answer at this time. Voicemail left.   Letter sent.   

## 2019-11-27 ENCOUNTER — Encounter: Payer: Self-pay | Admitting: Family Medicine

## 2019-11-27 ENCOUNTER — Ambulatory Visit (INDEPENDENT_AMBULATORY_CARE_PROVIDER_SITE_OTHER): Payer: 59 | Admitting: Family Medicine

## 2019-11-27 VITALS — BP 123/60 | HR 80 | Temp 98.2°F | Ht 59.84 in | Wt 193.0 lb

## 2019-11-27 DIAGNOSIS — F331 Major depressive disorder, recurrent, moderate: Secondary | ICD-10-CM | POA: Diagnosis not present

## 2019-11-27 MED ORDER — OLANZAPINE 5 MG PO TABS: 5.0000 mg | ORAL_TABLET | Freq: Every day | ORAL | 0 refills | Status: DC

## 2019-11-27 NOTE — Progress Notes (Signed)
Pt states she stopped taking bipolar medication due to pregnancy.  She would like medication that is pregnancy safe to treat bipolar disorder 'before she ends up killing somebody'.

## 2019-11-27 NOTE — Assessment & Plan Note (Signed)
Discussed with her the options.  Out of the antipsychotics olanzapine and Seroquel have the most out of during pregnancy and she is through the first trimester.  Abilify has some contraindications with third trimester so we will hold off on restarting that particular medication and try Zyprexa instead.  New prescription started I will see her back in 2 to 3 weeks to make sure that she is doing well and tolerating well.  Did encourage her to let her OB/GYN know.

## 2019-11-27 NOTE — Progress Notes (Signed)
Established Patient Office Visit  Subjective:  Patient ID: Shirley Olson, female    DOB: [birth year]  Age: 24 y.o. MRN: [id number]  CC: No chief complaint on file.   HPI Ruthy Forry presents f for follow-up mood.  She says she has been very irritable and cranky.  She is currently about [redacted] weeks pregnant.  She did get a new job where she has paid more but works long shifts of 12 hours and says it is tiring.  She still living with her parents and her daughter.  She reports been feeling better off dead at times more than half the days but says she does not actively have a plan and feels safe to go home and be with her family.  She says she would not hurt herself.  She was previously on Abilify for significant depression symptoms.  She has tried Seroquel, Prozac, Lexapro, Abilify.  Out of all of them the one that she felt worked the best to help control her mood fluctuations where the Abilify was the Abilify.  Past Medical History:  Diagnosis Date  . Arm fracture, left    distal radius, buckle fracture  . Arm fracture, right 2009 for a   Distal radius    Past Surgical History:  Procedure Laterality Date  . TONSILLECTOMY    . TONSILLECTOMY AND ADENOIDECTOMY  08/2006    Family History  Problem Relation Age of Onset  . Depression Other   . Diabetes Other     Social History   Socioeconomic History  . Marital status: Single    Spouse name: Not on file  . Number of children: Not on file  . Years of education: Not on file  . Highest education level: Not on file  Occupational History  . Occupation: Ship broker  Tobacco Use  . Smoking status: Never Smoker  . Smokeless tobacco: Never Used  Substance and Sexual Activity  . Alcohol use: No  . Drug use: No  . Sexual activity: Never    Comment: Lives with both parents, no sports, attends school.  Other Topics Concern  . Not on file  Social History Narrative    No sports.     Social Determinants of Health   Financial Resource  Strain:   . Difficulty of Paying Living Expenses:   Food Insecurity:   . Worried About Charity fundraiser in the Last Year:   . Arboriculturist in the Last Year:   Transportation Needs:   . Film/video editor (Medical):   Marland Kitchen Lack of Transportation (Non-Medical):   Physical Activity:   . Days of Exercise per Week:   . Minutes of Exercise per Session:   Stress:   . Feeling of Stress :   Social Connections:   . Frequency of Communication with Friends and Family:   . Frequency of Social Gatherings with Friends and Family:   . Attends Religious Services:   . Active Member of Clubs or Organizations:   . Attends Archivist Meetings:   Marland Kitchen Marital Status:   Intimate Partner Violence:   . Fear of Current or Ex-Partner:   . Emotionally Abused:   Marland Kitchen Physically Abused:   . Sexually Abused:     Outpatient Medications Prior to Visit  Medication Sig Dispense Refill  . butalbital-acetaminophen-caffeine (FIORICET) 50-325-40 MG tablet     . Prenatal Vit-Fe Fumarate-FA (PRENATAL MULTIVITAMIN) TABS tablet Take 1 tablet by mouth daily at 12 noon.    . famotidine (PEPCID) 20  MG tablet Take 1 tablet (20 mg total) by mouth 2 (two) times daily for 15 days. 30 tablet 0  . mupirocin ointment (BACTROBAN) 2 % Apply 1 application topically 3 (three) times daily. 30 g 0   No facility-administered medications prior to visit.    No Known Allergies  ROS Review of Systems    Objective:    Physical Exam  BP 123/60 (BP Location: Left Arm, Patient Position: Sitting, Cuff Size: Normal)   Pulse 80   Temp 98.2 F (36.8 C) (Oral)   Ht 4' 11.84" (1.52 m)   Wt 193 lb (87.5 kg)   LMP 07/26/2019   SpO2 100%   BMI 37.89 kg/m  Wt Readings from Last 3 Encounters:  11/27/19 193 lb (87.5 kg)  10/26/19 189 lb 9.5 oz (86 kg)  09/29/19 191 lb (86.6 kg)     Health Maintenance Due  Topic Date Due  . HIV Screening  Never done    There are no preventive care reminders to display for this  patient.  Lab Results  Component Value Date   TSH 0.801 06/27/2011   Lab Results  Component Value Date   WBC 6.5 06/27/2011   HGB 13.5 06/27/2011   HCT 40.1 06/27/2011   MCV 85.9 06/27/2011   PLT 218 06/27/2011   No results found for: NA, K, CHLORIDE, CO2, GLUCOSE, BUN, CREATININE, BILITOT, ALKPHOS, AST, ALT, PROT, ALBUMIN, CALCIUM, ANIONGAP, EGFR, GFR No results found for: CHOL No results found for: HDL No results found for: LDLCALC No results found for: TRIG No results found for: CHOLHDL Lab Results  Component Value Date   HGBA1C 4.4 12/29/2016      Assessment & Plan:   Problem List Items Addressed This Visit      Other   MDD (major depressive disorder), recurrent episode ([place]) - Primary    Discussed with her the options.  Out of the antipsychotics olanzapine and Seroquel have the most out of during pregnancy and she is through the first trimester.  Abilify has some contraindications with third trimester so we will hold off on restarting that particular medication and try Zyprexa instead.  New prescription started I will see her back in 2 to 3 weeks to make sure that she is doing well and tolerating well.  Did encourage her to let her OB/GYN know.          Meds ordered this encounter  Medications  . OLANZapine (ZYPREXA) 5 MG tablet    Sig: Take 1 tablet (5 mg total) by mouth at bedtime.    Dispense:  30 tablet    Refill:  0    Follow-up: Return in about 2 weeks (around 12/11/2019) for recheckmood.    Beatrice Lecher, MD

## 2019-12-12 ENCOUNTER — Other Ambulatory Visit: Payer: Self-pay

## 2019-12-12 ENCOUNTER — Encounter: Payer: Self-pay | Admitting: Family Medicine

## 2019-12-12 ENCOUNTER — Ambulatory Visit (INDEPENDENT_AMBULATORY_CARE_PROVIDER_SITE_OTHER): Payer: 59 | Admitting: Family Medicine

## 2019-12-12 VITALS — BP 110/50 | HR 82 | Ht 60.0 in | Wt 196.0 lb

## 2019-12-12 DIAGNOSIS — F331 Major depressive disorder, recurrent, moderate: Secondary | ICD-10-CM | POA: Diagnosis not present

## 2019-12-12 MED ORDER — SERTRALINE HCL 50 MG PO TABS: ORAL_TABLET | ORAL | 0 refills | Status: DC

## 2019-12-12 MED ORDER — OLANZAPINE 2.5 MG PO TABS: 2.5000 mg | ORAL_TABLET | Freq: Every day | ORAL | 0 refills | Status: DC

## 2019-12-12 NOTE — Progress Notes (Signed)
Established Patient Office Visit  Subjective:  Patient ID: Shirley Olson, female    DOB: [birth year]  Age: 24 y.o. MRN: [id number]  CC:  Chief Complaint  Patient presents with  . mood    HPI Shirley Olson presents for F/U Mood.  She is currently cam in with sig increase in her mood symptom including high levels of irritability and moderate levels of depression.  She had previously been on a mood stabilizer so we had a long discussion about options.  I did go ahead and restart her on Abilify for the short-term.  But I really like to transition her to an SSRI.  Will consider sertraline or citalopram being that she is pregnant.  Recently seen at urgent care for epigastric pain diagnosed with GERD and treated with famotidine.  He is actually doing well and wanted to let me know that she is having a boy.  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  Past Medical History:  Diagnosis Date  . Arm fracture, left    distal radius, buckle fracture  . Arm fracture, right 2009 for a   Distal radius    Past Surgical History:  Procedure Laterality Date  . TONSILLECTOMY    . TONSILLECTOMY AND ADENOIDECTOMY  08/2006    Family History  Problem Relation Age of Onset  . Depression Other   . Diabetes Other     Social History   Socioeconomic History  . Marital status: Single    Spouse name: Not on file  . Number of children: Not on file  . Years of education: Not on file  . Highest education level: Not on file  Occupational History  . Occupation: Ship broker  Tobacco Use  . Smoking status: Never Smoker  . Smokeless tobacco: Never Used  Substance and Sexual Activity  . Alcohol use: No  . Drug use: No  . Sexual activity: Never    Comment: Lives with both parents, no sports, attends school.  Other  Topics Concern  . Not on file  Social History Narrative    No sports.     Social Determinants of Health   Financial Resource Strain:   . Difficulty of Paying Living Expenses:   Food Insecurity:   . Worried About Charity fundraiser in the Last Year:   . Arboriculturist in the Last Year:   Transportation Needs:   . Film/video editor (Medical):   Marland Kitchen Lack of Transportation (Non-Medical):   Physical Activity:   . Days of Exercise per Week:   . Minutes of Exercise per Session:   Stress:   . Feeling of Stress :   Social Connections:   . Frequency of Communication with Friends and Family:   . Frequency of Social Gatherings with Friends and Family:   . Attends Religious Services:   . Active Member of Clubs or Organizations:   . Attends Archivist Meetings:   Marland Kitchen Marital Status:   Intimate Partner Violence:   . Fear of Current or Ex-Partner:   . Emotionally Abused:   Marland Kitchen Physically Abused:   . Sexually Abused:     Outpatient Medications Prior to Visit  Medication Sig Dispense Refill  . butalbital-acetaminophen-caffeine (FIORICET) 50-325-40 MG tablet     .  Prenatal Vit-Fe Fumarate-FA (PRENATAL MULTIVITAMIN) TABS tablet Take 1 tablet by mouth daily at 12 noon.    . famotidine (PEPCID) 20 MG tablet Take 1 tablet (20 mg total) by mouth 2 (two) times daily for 15 days. 30 tablet 0  . OLANZapine (ZYPREXA) 5 MG tablet Take 1 tablet (5 mg total) by mouth at bedtime. 30 tablet 0   No facility-administered medications prior to visit.    No Known Allergies  ROS Review of Systems    Objective:    Physical Exam  Constitutional: She is oriented to person, place, and time. She appears well-developed and well-nourished.  HENT:  Head: Normocephalic and atraumatic.  Cardiovascular: Normal rate, regular rhythm and normal heart sounds.  Pulmonary/Chest: Effort normal and breath sounds normal.  Neurological: She is alert and oriented to person, place, and time.  Skin: Skin is  warm and dry.  Psychiatric: She has a normal mood and affect. Her behavior is normal.    BP (!) 110/50   Pulse 82   Ht 5' (1.524 m)   Wt 196 lb (88.9 kg)   LMP 07/26/2019   SpO2 100%   BMI 38.28 kg/m  Wt Readings from Last 3 Encounters:  12/12/19 196 lb (88.9 kg)  11/27/19 193 lb (87.5 kg)  10/26/19 189 lb 9.5 oz (86 kg)     Health Maintenance Due  Topic Date Due  . HIV Screening  Never done    There are no preventive care reminders to display for this patient.  Lab Results  Component Value Date   TSH 0.801 06/27/2011   Lab Results  Component Value Date   WBC 6.5 06/27/2011   HGB 13.5 06/27/2011   HCT 40.1 06/27/2011   MCV 85.9 06/27/2011   PLT 218 06/27/2011   No results found for: NA, K, CHLORIDE, CO2, GLUCOSE, BUN, CREATININE, BILITOT, ALKPHOS, AST, ALT, PROT, ALBUMIN, CALCIUM, ANIONGAP, EGFR, GFR No results found for: CHOL No results found for: HDL No results found for: LDLCALC No results found for: TRIG No results found for: CHOLHDL Lab Results  Component Value Date   HGBA1C 4.4 12/29/2016      Assessment & Plan:   Problem List Items Addressed This Visit      Other   MDD (major depressive disorder), recurrent episode ([place]) - Primary    Discussed options.  When she was seen at [facility] for an ADD evaluation they did a pretty thorough assessment and felt like she really did not have underlying bipolar disorder.  When I saw her a couple of weeks ago she was quite distressed so we decided to go ahead and restart the Zyprexa which she had taken previously and done really well with knowing that it would kick in very quickly to help reduce some of her symptoms.  Today I really wanted to discuss with her transitioning back to an SSRI which should be effective for her as long as we can find 1 that works for her and is relatively safe during her pregnancy.  So we discussed transitioning off of the Zyprexa over the next few weeks and starting sertraline to help with  the depression and anxiety and irritability symptoms.  I will see her back in 3 weeks if she has any problems please let me know.  Did encourage her once again to let her OB/GYN know about all the medication changes.      Relevant Medications   sertraline (ZOLOFT) 50 MG tablet      Meds ordered this  encounter  Medications  . OLANZapine (ZYPREXA) 2.5 MG tablet    Sig: Take 1 tablet (2.5 mg total) by mouth at bedtime.    Dispense:  15 tablet    Refill:  0  . sertraline (ZOLOFT) 50 MG tablet    Sig: 1/2 tab po QD x 10 days, the increase to whole tab daily    Dispense:  30 tablet    Refill:  0    Follow-up: Return in about 3 weeks (around 01/02/2020) for change in medication.  Beatrice Lecher, MD

## 2019-12-12 NOTE — Patient Instructions (Signed)
Please start the sertraline.  You will take half a tab daily for 10 days.  He will continue to take the Zyprexa 5 mg for those 10 days as well.  After 10 days she will increase the sertraline to a whole tab and take a whole tab once a day.  You will take the Zyprexa 5 mg for 4 more days with the sertraline, and then after that start the Zyprexa 2.5 mg pill.  You will stay on the Zyprexa 2.5 mg pill for 2 weeks or until you run out of the new bottle.  You will continue to take the sertraline whole tab daily during this time.  Once you complete the Zyprexa you are done with it and do not need to refill it.  But you will continue with the sertraline every day until I see you back

## 2019-12-12 NOTE — Assessment & Plan Note (Signed)
Discussed options.  When she was seen at [facility] for an ADD evaluation they did a pretty thorough assessment and felt like she really did not have underlying bipolar disorder.  When I saw her a couple of weeks ago she was quite distressed so we decided to go ahead and restart the Zyprexa which she had taken previously and done really well with knowing that it would kick in very quickly to help reduce some of her symptoms.  Today I really wanted to discuss with her transitioning back to an SSRI which should be effective for her as long as we can find 1 that works for her and is relatively safe during her pregnancy.  So we discussed transitioning off of the Zyprexa over the next few weeks and starting sertraline to help with the depression and anxiety and irritability symptoms.  I will see her back in 3 weeks if she has any problems please let me know.  Did encourage her once again to let her OB/GYN know about all the medication changes.

## 2020-01-02 ENCOUNTER — Encounter: Payer: Self-pay | Admitting: Family Medicine

## 2020-01-02 ENCOUNTER — Ambulatory Visit (INDEPENDENT_AMBULATORY_CARE_PROVIDER_SITE_OTHER): Payer: 59 | Admitting: Family Medicine

## 2020-01-02 ENCOUNTER — Other Ambulatory Visit: Payer: Self-pay

## 2020-01-02 DIAGNOSIS — F331 Major depressive disorder, recurrent, moderate: Secondary | ICD-10-CM | POA: Diagnosis not present

## 2020-01-02 MED ORDER — SERTRALINE HCL 50 MG PO TABS: 50.0000 mg | ORAL_TABLET | Freq: Every day | ORAL | 2 refills | Status: DC

## 2020-01-02 NOTE — Progress Notes (Signed)
Established Patient Office Visit  Subjective:  Patient ID: Shirley Olson, female    DOB: 16-Jul-1996  Age: 24 y.o. MRN: [id number]  CC:  Chief Complaint  Patient presents with  . mood    HPI Shirley Olson presents for Mood.  She has stopped Zyprexa and is taking Zoloft and feels that she is doing well. She has not noticed any significant side effect she has been on the full tab for about a week thus far. She actually feels like it has been effective. She still working about 40 hours a week. Her due date for pregnancy is in August.  Past Medical History:  Diagnosis Date  . Arm fracture, left    distal radius, buckle fracture  . Arm fracture, right 2009 for a   Distal radius    Past Surgical History:  Procedure Laterality Date  . TONSILLECTOMY    . TONSILLECTOMY AND ADENOIDECTOMY  08/2006    Family History  Problem Relation Age of Onset  . Depression Other   . Diabetes Other     Social History   Socioeconomic History  . Marital status: Single    Spouse name: Not on file  . Number of children: Not on file  . Years of education: Not on file  . Highest education level: Not on file  Occupational History  . Occupation: Ship broker  Tobacco Use  . Smoking status: Never Smoker  . Smokeless tobacco: Never Used  Substance and Sexual Activity  . Alcohol use: No  . Drug use: No  . Sexual activity: Never    Comment: Lives with both parents, no sports, attends school.  Other Topics Concern  . Not on file  Social History Narrative    No sports.     Social Determinants of Health   Financial Resource Strain:   . Difficulty of Paying Living Expenses:   Food Insecurity:   . Worried About Charity fundraiser in the Last Year:   . Arboriculturist in the Last Year:   Transportation Needs:   . Film/video editor (Medical):   Marland Kitchen Lack of Transportation (Non-Medical):   Physical Activity:   . Days of Exercise per Week:   . Minutes of Exercise per Session:   Stress:   .  Feeling of Stress :   Social Connections:   . Frequency of Communication with Friends and Family:   . Frequency of Social Gatherings with Friends and Family:   . Attends Religious Services:   . Active Member of Clubs or Organizations:   . Attends Archivist Meetings:   Marland Kitchen Marital Status:   Intimate Partner Violence:   . Fear of Current or Ex-Partner:   . Emotionally Abused:   Marland Kitchen Physically Abused:   . Sexually Abused:     Outpatient Medications Prior to Visit  Medication Sig Dispense Refill  . butalbital-acetaminophen-caffeine (FIORICET) 50-325-40 MG tablet     . Prenatal Vit-Fe Fumarate-FA (PRENATAL MULTIVITAMIN) TABS tablet Take 1 tablet by mouth daily at 12 noon.    Marland Kitchen OLANZapine (ZYPREXA) 2.5 MG tablet Take 1 tablet (2.5 mg total) by mouth at bedtime. 15 tablet 0  . sertraline (ZOLOFT) 50 MG tablet 1/2 tab po QD x 10 days, the increase to whole tab daily 30 tablet 0   No facility-administered medications prior to visit.    No Known Allergies  ROS Review of Systems    Objective:    Physical Exam  Constitutional: She is oriented to person, place,  and time. She appears well-developed and well-nourished.  HENT:  Head: Normocephalic and atraumatic.  Cardiovascular: Normal rate, regular rhythm and normal heart sounds.  Pulmonary/Chest: Effort normal and breath sounds normal.  Neurological: She is alert and oriented to person, place, and time.  Skin: Skin is warm and dry.  Psychiatric: She has a normal mood and affect. Her behavior is normal.    BP 103/80   Pulse 74   Ht 5' (1.524 m)   Wt 197 lb (89.4 kg)   LMP 07/26/2019   SpO2 100%   BMI 38.47 kg/m  Wt Readings from Last 3 Encounters:  01/02/20 197 lb (89.4 kg)  12/12/19 196 lb (88.9 kg)  11/27/19 193 lb (87.5 kg)     There are no preventive care reminders to display for this patient.  There are no preventive care reminders to display for this patient.  Lab Results  Component Value Date   TSH  0.801 06/27/2011   Lab Results  Component Value Date   WBC 6.5 06/27/2011   HGB 13.5 06/27/2011   HCT 40.1 06/27/2011   MCV 85.9 06/27/2011   PLT 218 06/27/2011   No results found for: NA, K, CHLORIDE, CO2, GLUCOSE, BUN, CREATININE, BILITOT, ALKPHOS, AST, ALT, PROT, ALBUMIN, CALCIUM, ANIONGAP, EGFR, GFR No results found for: CHOL No results found for: HDL No results found for: LDLCALC No results found for: TRIG No results found for: CHOLHDL Lab Results  Component Value Date   HGBA1C 4.4 12/29/2016      Assessment & Plan:   Problem List Items Addressed This Visit      Other   MDD (major depressive disorder), recurrent episode (Shirley Olson)    We discussed continuing the Zoloft at 50 mg if she does well at a lower dose and rather keep her at that especially while she is pregnant but certainly we can adjust upward if needed. So at this point in discussing her back in 8 weeks she felt comfortable with that. Continue to work on [employer], staying active and continuing her prenatal vitamin.      Relevant Medications   sertraline (ZOLOFT) 50 MG tablet      Meds ordered this encounter  Medications  . sertraline (ZOLOFT) 50 MG tablet    Sig: Take 1 tablet (50 mg total) by mouth daily. 1/2 tab po QD x 10 days, the increase to whole tab daily    Dispense:  30 tablet    Refill:  2    Follow-up: Return in about 2 months (around 03/03/2020) for Medication .    Beatrice Lecher, MD

## 2020-01-02 NOTE — Assessment & Plan Note (Signed)
We discussed continuing the Zoloft at 50 mg if she does well at a lower dose and rather keep her at that especially while she is pregnant but certainly we can adjust upward if needed. So at this point in discussing her back in 8 weeks she felt comfortable with that. Continue to work on [employer], staying active and continuing her prenatal vitamin.

## 2020-01-02 NOTE — Progress Notes (Signed)
She has stopped Zyprexa and is taking Zoloft and feels that she is doing well.   Previous PHQ=15/swd  GAD=14/swd

## 2020-03-04 ENCOUNTER — Ambulatory Visit: Payer: 59 | Admitting: Family Medicine

## 2020-04-07 DIAGNOSIS — Z22330 Carrier of Group B streptococcus: Secondary | ICD-10-CM | POA: Insufficient documentation

## 2020-04-29 ENCOUNTER — Encounter: Payer: Self-pay | Admitting: Family Medicine

## 2020-04-29 ENCOUNTER — Telehealth (INDEPENDENT_AMBULATORY_CARE_PROVIDER_SITE_OTHER): Payer: 59 | Admitting: Family Medicine

## 2020-04-29 DIAGNOSIS — J069 Acute upper respiratory infection, unspecified: Secondary | ICD-10-CM | POA: Diagnosis not present

## 2020-04-29 NOTE — Progress Notes (Signed)
Pt reports that last week when she gave birth she began to spike a temperature. She was given Tylenol and Oxygen.  She denies having a temperature at present but c/o her throat being dry, Headache, runny nose, and dry cough. She took Tylenol severe cold and flu this morning. No f/s/c/n/v/d. She is not breast feeding.

## 2020-04-29 NOTE — Progress Notes (Signed)
Virtual Visit via Video Note  I connected with Shirley Olson on 04/29/20 at 10:30 AM EDT by a video enabled telemedicine application and verified that I am speaking with the correct person using two identifiers.   I discussed the limitations of evaluation and management by telemedicine and the availability of in person appointments. The patient expressed understanding and agreed to proceed.  Patient location: at home Provider location: in office  Subjective:    CC: Fever  HPI: 24 year old female complains  that last week when she gave birth she began to spike a temperature. She was given Tylenol and Oxygen. She has not run a fever since then and did well until Saturday.  She says on Saturday approximately, 6 days ago, she started develop a sore throat.  By that evening she had started to develop a mild cough.  She denies any chest pain or shortness of breath.  She is just felt fatigued.  She denies having a temperature at present but c/o her throat being dry, Headache, runny nose, and dry cough. She took Tylenol severe cold and flu this morning. No f/s/c/n/v/d. She is not breast feeding.   She has had frontal headache bilaterally.  Not worse on one side.  No GI symptoms  Did let me know that her oldest daughter who is a preschooler has tested positive for RSV.   Past medical history, Surgical history, Family history not pertinant except as noted below, Social history, Allergies, and medications have been entered into the medical record, reviewed, and corrections made.   Review of Systems: No fevers, chills, night sweats, weight loss, chest pain, or shortness of breath.   Objective:    General: Speaking clearly in complete sentences without any shortness of breath.  Alert and oriented x3.  Normal judgment. No apparent acute distress.  He sounds congested.    Impression and Recommendations:    No problem-specific Assessment & Plan notes found for this encounter. Respiratory  infection-possibly RSV since her daughter has tested positive earlier this week.  But also just one make sure that she does not have Covid as she was in the hospital to give birth on August 26 so she could have potentially had a healthcare exposure.  Either way this is likely viral recommend continue symptomatic care getting plenty of rest, hydrating and okay to continue over-the-counter cold medications since she is not nursing.     Time spent in encounter 20 minutes  I discussed the assessment and treatment plan with the patient. The patient was provided an opportunity to ask questions and all were answered. The patient agreed with the plan and demonstrated an understanding of the instructions.   The patient was advised to call back or seek an in-person evaluation if the symptoms worsen or if the condition fails to improve as anticipated.   Nani Gasser, MD

## 2020-05-01 LAB — NOVEL CORONAVIRUS, NAA: SARS-CoV-2, NAA: NOT DETECTED

## 2020-05-01 LAB — SPECIMEN STATUS REPORT

## 2020-07-12 ENCOUNTER — Ambulatory Visit: Payer: 59 | Admitting: Family Medicine

## 2020-07-13 ENCOUNTER — Ambulatory Visit (INDEPENDENT_AMBULATORY_CARE_PROVIDER_SITE_OTHER): Payer: 59 | Admitting: Family Medicine

## 2020-07-13 ENCOUNTER — Encounter: Payer: Self-pay | Admitting: Family Medicine

## 2020-07-13 VITALS — BP 114/65 | HR 86

## 2020-07-13 DIAGNOSIS — Z23 Encounter for immunization: Secondary | ICD-10-CM

## 2020-07-13 DIAGNOSIS — F331 Major depressive disorder, recurrent, moderate: Secondary | ICD-10-CM | POA: Diagnosis not present

## 2020-07-13 DIAGNOSIS — O99345 Other mental disorders complicating the puerperium: Secondary | ICD-10-CM | POA: Insufficient documentation

## 2020-07-13 DIAGNOSIS — F53 Postpartum depression: Secondary | ICD-10-CM | POA: Insufficient documentation

## 2020-07-13 MED ORDER — SERTRALINE HCL 50 MG PO TABS: 50.0000 mg | ORAL_TABLET | Freq: Every day | ORAL | 2 refills | Status: DC

## 2020-07-13 NOTE — Progress Notes (Signed)
Established Patient Office Visit  Subjective:  Patient ID: Shirley Olson, female    DOB: 1995/12/16  Age: 24 y.o. MRN: [id number]  CC:  Chief Complaint  Patient presents with  . mood    HPI Shirley Olson presents for follow-up mood.  She has had a baby since I last saw her in August.  She has been out of her prescription medication for about a month.  She was on sertraline previously and was doing well on it she was on 50 mg.  She does feel like she has been more down and felt a little bit more out of sorts since having the baby she did have postpartum depression with her first child.  She is also noticed a lot of increased irritability.  She works at the [employer] and actually [employer] locations and says unfortunately its been a worse working environment at this new place.  The been no inappropriate comments by a coworker in particular which she has reported to management.  She rates her symptoms as very difficult and has had thoughts of wanting to be better off dead but says that she would not harm herself.  She also reports feeling very anxious and like she worries too much.  GAD-7 score of 25 and PHQ-9 score of 20.  She is open to doing therapy and counseling.   Past Medical History:  Diagnosis Date  . Arm fracture, left    distal radius, buckle fracture  . Arm fracture, right 2009 for a   Distal radius    Past Surgical History:  Procedure Laterality Date  . TONSILLECTOMY    . TONSILLECTOMY AND ADENOIDECTOMY  08/2006    Family History  Problem Relation Age of Onset  . Depression Other   . Diabetes Other     Social History   Socioeconomic History  . Marital status: Single    Spouse name: Not on file  . Number of children: Not on file  . Years of education: Not on file  . Highest education level: Not on file  Occupational History  . Occupation: Ship broker  Tobacco Use  . Smoking status: Never Smoker  . Smokeless tobacco: Never Used  Substance and Sexual  Activity  . Alcohol use: No  . Drug use: No  . Sexual activity: Never    Comment: Lives with both parents, no sports, attends school.  Other Topics Concern  . Not on file  Social History Narrative    No sports.     Social Determinants of Health   Financial Resource Strain:   . Difficulty of Paying Living Expenses: Not on file  Food Insecurity:   . Worried About Charity fundraiser in the Last Year: Not on file  . Ran Out of Food in the Last Year: Not on file  Transportation Needs:   . Lack of Transportation (Medical): Not on file  . Lack of Transportation (Non-Medical): Not on file  Physical Activity:   . Days of Exercise per Week: Not on file  . Minutes of Exercise per Session: Not on file  Stress:   . Feeling of Stress : Not on file  Social Connections:   . Frequency of Communication with Friends and Family: Not on file  . Frequency of Social Gatherings with Friends and Family: Not on file  . Attends Religious Services: Not on file  . Active Member of Clubs or Organizations: Not on file  . Attends Archivist Meetings: Not on file  .  Marital Status: Not on file  Intimate Partner Violence:   . Fear of Current or Ex-Partner: Not on file  . Emotionally Abused: Not on file  . Physically Abused: Not on file  . Sexually Abused: Not on file    Outpatient Medications Prior to Visit  Medication Sig Dispense Refill  . butalbital-acetaminophen-caffeine (FIORICET) 50-325-40 MG tablet     . ibuprofen (ADVIL) 800 MG tablet Take by mouth.    . sertraline (ZOLOFT) 50 MG tablet Take 1 tablet (50 mg total) by mouth daily. 1/2 tab po QD x 10 days, the increase to whole tab daily 30 tablet 2   No facility-administered medications prior to visit.    No Known Allergies  ROS Review of Systems    Objective:    Physical Exam Vitals reviewed.  Constitutional:      Appearance: She is well-developed.  HENT:     Head: Normocephalic and atraumatic.  Eyes:      Conjunctiva/sclera: Conjunctivae normal.  Cardiovascular:     Rate and Rhythm: Normal rate.  Pulmonary:     Effort: Pulmonary effort is normal.  Skin:    General: Skin is dry.     Coloration: Skin is not pale.  Neurological:     Mental Status: She is alert and oriented to person, place, and time.  Psychiatric:        Behavior: Behavior normal.     BP 114/65   Pulse 86   SpO2 98%  Wt Readings from Last 3 Encounters:  01/02/20 197 lb (89.4 kg)  12/12/19 196 lb (88.9 kg)  11/27/19 193 lb (87.5 kg)     Health Maintenance Due  Topic Date Due  . Hepatitis C Screening  Never done    There are no preventive care reminders to display for this patient.  Lab Results  Component Value Date   TSH 0.801 06/27/2011   Lab Results  Component Value Date   WBC 6.5 06/27/2011   HGB 13.5 06/27/2011   HCT 40.1 06/27/2011   MCV 85.9 06/27/2011   PLT 218 06/27/2011   No results found for: NA, K, CHLORIDE, CO2, GLUCOSE, BUN, CREATININE, BILITOT, ALKPHOS, AST, ALT, PROT, ALBUMIN, CALCIUM, ANIONGAP, EGFR, GFR No results found for: CHOL No results found for: HDL No results found for: LDLCALC No results found for: TRIG No results found for: CHOLHDL Lab Results  Component Value Date   HGBA1C 4.4 12/29/2016      Assessment & Plan:   Problem List Items Addressed This Visit      Other   Post partum depression   Relevant Medications   sertraline (ZOLOFT) 50 MG tablet   MDD (major depressive disorder), recurrent episode (Shirley Olson) - Primary    Symptoms are currently uncontrolled.  PHQ-9 score of 25 and is having thoughts of being better off dead but no active thoughts of harming herself and says she does feel safe to go home and will not harm herself she does live with her parents who are supportive of her.  GAD-7 score of 20.  She is open to seeing a therapist/counselor which I think would be really helpful I do think some element of this is postpartum depression we will restart her Zoloft  and like to see her back in 2 weeks to make sure that she is doing well.      Relevant Medications   sertraline (ZOLOFT) 50 MG tablet   Other Relevant Orders   Ambulatory referral to [place]    Other  Visit Diagnoses    Need for immunization against influenza       Relevant Orders   Flu Vaccine QUAD 36+ mos IM (Completed)      Meds ordered this encounter  Medications  . sertraline (ZOLOFT) 50 MG tablet    Sig: Take 1 tablet (50 mg total) by mouth daily. 1/2 tab po QD x 8 days, the increase to whole tab daily    Dispense:  30 tablet    Refill:  2    Follow-up: Return in about 2 weeks (around 07/27/2020) for New start medication.  I spent 25 minutes on the day of the encounter to include pre-visit record review, face-to-face time with the patient and post visit ordering of test.   Beatrice Lecher, MD

## 2020-07-13 NOTE — Assessment & Plan Note (Signed)
Symptoms are currently uncontrolled.  PHQ-9 score of 25 and is having thoughts of being better off dead but no active thoughts of harming herself and says she does feel safe to go home and will not harm herself she does live with her parents who are supportive of her.  GAD-7 score of 20.  She is open to seeing a therapist/counselor which I think would be really helpful I do think some element of this is postpartum depression we will restart her Zoloft and like to see her back in 2 weeks to make sure that she is doing well.

## 2020-07-29 ENCOUNTER — Ambulatory Visit: Payer: 59 | Admitting: Family Medicine

## 2020-08-06 ENCOUNTER — Ambulatory Visit: Payer: 59 | Admitting: Professional

## 2020-08-10 ENCOUNTER — Ambulatory Visit: Payer: 59 | Admitting: Professional

## 2020-08-11 ENCOUNTER — Ambulatory Visit (INDEPENDENT_AMBULATORY_CARE_PROVIDER_SITE_OTHER): Payer: 59 | Admitting: Sports Medicine

## 2020-08-11 DIAGNOSIS — G5603 Carpal tunnel syndrome, bilateral upper limbs: Secondary | ICD-10-CM | POA: Diagnosis not present

## 2020-08-11 NOTE — Progress Notes (Signed)
    Procedures performed today:    None.  Independent interpretation of notes and tests performed by another provider:   None.  Brief History, Exam, Impression, and Recommendations:    Bilateral carpal tunnel syndrome This is a pleasant 24 year old female, she has 2 children under 2 years old, she had carpal tunnel during her pregnancies, she complains of numbness and tingling in both hands in the median nerve distribution, worse at night, she is wearing braces occasionally, right-sided only but not at night. I explained to her that the nerve is traumatized during flexion at night, because of this we will have her wear bilateral wrist braces for 4 weeks, do the carpal tunnel rehab exercises all before considering bilateral Hydro dissection at the next visit.    ___________________________________________ Ihor Austin. Benjamin Stain, M.D., ABFM., CAQSM. Primary Care and Sports Medicine [facility]  Adjunct Instructor of Family Medicine  [employer]

## 2020-08-11 NOTE — Assessment & Plan Note (Signed)
This is a pleasant 24 year old female, she has 2 children under 26 years old, she had carpal tunnel during her pregnancies, she complains of numbness and tingling in both hands in the median nerve distribution, worse at night, she is wearing braces occasionally, right-sided only but not at night. I explained to her that the nerve is traumatized during flexion at night, because of this we will have her wear bilateral wrist braces for 4 weeks, do the carpal tunnel rehab exercises all before considering bilateral Hydro dissection at the next visit.

## 2020-09-08 ENCOUNTER — Ambulatory Visit (INDEPENDENT_AMBULATORY_CARE_PROVIDER_SITE_OTHER): Payer: 59

## 2020-09-08 ENCOUNTER — Ambulatory Visit (INDEPENDENT_AMBULATORY_CARE_PROVIDER_SITE_OTHER): Payer: 59 | Admitting: Sports Medicine

## 2020-09-08 ENCOUNTER — Other Ambulatory Visit: Payer: Self-pay

## 2020-09-08 DIAGNOSIS — G5603 Carpal tunnel syndrome, bilateral upper limbs: Secondary | ICD-10-CM | POA: Diagnosis not present

## 2020-09-08 NOTE — Progress Notes (Signed)
    Procedures performed today:    Procedure: Real-time Ultrasound Guided hydrodissection of the right median nerve at the carpal tunnel Device: Samsung HS60 Verbal informed consent obtained.  Time-out conducted.  Noted no overlying erythema, induration, or other signs of local infection.  Skin prepped in a sterile fashion.  Local anesthesia: Topical Ethyl chloride.  With sterile technique and under real time ultrasound guidance: Using a 25-gauge needle advanced into the carpal tunnel, taking care to avoid intraneural injection I injected medication both superficial to and deep to the median nerve freeing it from surrounding structures, I then redirected the needle deep and injected further medication around the flexor tendons deep within the carpal tunnel for a total of 1 cc kenalog 40, 5 cc 1% lidocaine without epinephrine. Completed without difficulty  Advised to call if fevers/chills, erythema, induration, drainage, or persistent bleeding.  Images permanently stored and available for review in PACS.  Impression: Technically successful ultrasound guided median nerve hydrodissection.  Independent interpretation of notes and tests performed by another provider:   None.  Brief History, Exam, Impression, and Recommendations:    Bilateral carpal tunnel syndrome This is a very pleasant 25 year old female, she has 2 children under 41 years old, carpal tunnel during both of her pregnancies, numbness and tingling in both hands in a median nerve distribution. Braces have not been effective, we added bilateral bracing, and unfortunately continues to have pain right worse than left, numbness, tingling. Today we performed a right-sided median nerve hydrodissection, return to see me in a month, we can do the left side if she desires.    ___________________________________________ Ihor Austin. Benjamin Stain, M.D., ABFM., CAQSM. Primary Care and Sports Medicine [facility]  [facility]  Adjunct Instructor of Family Medicine  [employer]

## 2020-09-08 NOTE — Assessment & Plan Note (Signed)
This is a very pleasant 25 year old female, she has 2 children under 67 years old, carpal tunnel during both of her pregnancies, numbness and tingling in both hands in a median nerve distribution. Braces have not been effective, we added bilateral bracing, and unfortunately continues to have pain right worse than left, numbness, tingling. Today we performed a right-sided median nerve hydrodissection, return to see me in a month, we can do the left side if she desires.

## 2020-09-25 ENCOUNTER — Other Ambulatory Visit: Payer: Self-pay

## 2020-09-25 ENCOUNTER — Emergency Department (INDEPENDENT_AMBULATORY_CARE_PROVIDER_SITE_OTHER)
Admission: EM | Admit: 2020-09-25 | Discharge: 2020-09-25 | Disposition: A | Discharge status: Home / Self Care | Payer: 59 | Source: Home / Self Care

## 2020-09-25 ENCOUNTER — Encounter: Payer: Self-pay | Admitting: Emergency Medicine

## 2020-09-25 DIAGNOSIS — R300 Dysuria: Secondary | ICD-10-CM

## 2020-09-25 DIAGNOSIS — L259 Unspecified contact dermatitis, unspecified cause: Secondary | ICD-10-CM | POA: Diagnosis not present

## 2020-09-25 MED ORDER — LEVOCETIRIZINE DIHYDROCHLORIDE 5 MG PO TABS: 5.0000 mg | ORAL_TABLET | Freq: Every evening | ORAL | 0 refills | Status: DC

## 2020-09-25 MED ORDER — TRIAMCINOLONE ACETONIDE 0.025 % EX OINT: 1.0000 "application " | TOPICAL_OINTMENT | Freq: Three times a day (TID) | CUTANEOUS | 0 refills | Status: DC | PRN

## 2020-09-25 MED ORDER — DEXAMETHASONE SODIUM PHOSPHATE 10 MG/ML IJ SOLN
10.0000 mg | Freq: Once | INTRAMUSCULAR | Status: AC
  Administered 2020-09-25: 10 mg via INTRAMUSCULAR
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
## 2020-09-25 NOTE — ED Triage Notes (Signed)
Patient c/o rash on arms, legs and stomach x 2 days, itching, redness.  No new soaps, lotions or detergents.  Patient has not tried anything OTC.

## 2020-09-25 NOTE — Discharge Instructions (Addendum)
Apply triamcinolone cream to skin where rash is present 3 times daily until rash resolves which should be within the next 5 to 7 days.  Also take Xyzal 5 mg at bedtime and continue until rash completely resolves.

## 2020-09-27 ENCOUNTER — Encounter: Payer: Self-pay | Admitting: Family Medicine

## 2020-09-28 ENCOUNTER — Telehealth (INDEPENDENT_AMBULATORY_CARE_PROVIDER_SITE_OTHER): Payer: 59 | Admitting: Physician Assistant

## 2020-09-28 VITALS — Ht 60.0 in | Wt 197.0 lb

## 2020-09-28 DIAGNOSIS — Z5329 Procedure and treatment not carried out because of patient's decision for other reasons: Secondary | ICD-10-CM

## 2020-09-28 DIAGNOSIS — Z91199 Patient's noncompliance with other medical treatment and regimen due to unspecified reason: Secondary | ICD-10-CM

## 2020-09-28 NOTE — Progress Notes (Signed)
Patient ID: Shirley Olson, female   DOB: 12/30/1995, 25 y.o.   MRN: [id number] Not able to make contact with patient.   No charge. She was on and when I called did not answer.

## 2020-09-28 NOTE — Progress Notes (Signed)
Rash - hands, arms, stomach Started 1 week ago with itching, skin changes started this past weekend Went to urgent care Saturday - gave her a steroid shot which helped some Also gave RX for Xyzal (which she is taking) and Triamcinolone cream (pharmacy out of stock, not sure they were switching medication, transferring to another pharmacy, etc)

## 2020-10-06 ENCOUNTER — Ambulatory Visit: Payer: 59 | Admitting: Sports Medicine

## 2020-12-19 ENCOUNTER — Other Ambulatory Visit: Payer: Self-pay | Admitting: Family Medicine

## 2021-06-07 LAB — HM PAP SMEAR: HM Pap smear: NEGATIVE

## 2021-08-10 ENCOUNTER — Emergency Department (INDEPENDENT_AMBULATORY_CARE_PROVIDER_SITE_OTHER)
Admission: EM | Admit: 2021-08-10 | Discharge: 2021-08-10 | Disposition: A | Discharge status: Home / Self Care | Payer: Self-pay | Source: Home / Self Care | Attending: Family Medicine | Admitting: Family Medicine

## 2021-08-10 DIAGNOSIS — G44209 Tension-type headache, unspecified, not intractable: Secondary | ICD-10-CM

## 2021-08-10 DIAGNOSIS — M542 Cervicalgia: Secondary | ICD-10-CM

## 2021-08-10 MED ORDER — IBUPROFEN 600 MG PO TABS: 600.0000 mg | ORAL_TABLET | Freq: Four times a day (QID) | ORAL | 0 refills | Status: DC | PRN

## 2021-08-10 NOTE — Discharge Instructions (Signed)
May take ibuprofen 3 times a day with food Use ice or heat to painful back and neck muscles See your doctor if not improving by next week
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
## 2021-09-02 ENCOUNTER — Telehealth: Payer: Self-pay | Admitting: Family Medicine

## 2021-09-02 NOTE — Telephone Encounter (Signed)
Call patient and remind her that she is due for her Pap smear not sure when her next appoint with her OB/GYN is.  Also see if she has had a flu shot this year so we can document that as well.

## 2021-09-02 NOTE — Telephone Encounter (Signed)
Spoke with pt who states that she had pap done at [facility] in October, 2022.  Faxed request for records to [facility].  Pt stated that she has not had a flu shot this season.  Pt scheduled appointment for flu vaccine on 09/05/2021.  Tiajuana Amass, CMA

## 2021-09-05 ENCOUNTER — Ambulatory Visit: Payer: 59

## 2021-10-12 ENCOUNTER — Other Ambulatory Visit: Payer: Self-pay | Admitting: Family Medicine

## 2021-11-04 ENCOUNTER — Ambulatory Visit (INDEPENDENT_AMBULATORY_CARE_PROVIDER_SITE_OTHER): Payer: 59

## 2021-11-04 ENCOUNTER — Ambulatory Visit (INDEPENDENT_AMBULATORY_CARE_PROVIDER_SITE_OTHER): Payer: 59 | Admitting: Sports Medicine

## 2021-11-04 ENCOUNTER — Other Ambulatory Visit: Payer: Self-pay

## 2021-11-04 DIAGNOSIS — G8929 Other chronic pain: Secondary | ICD-10-CM

## 2021-11-04 DIAGNOSIS — M542 Cervicalgia: Secondary | ICD-10-CM

## 2021-11-04 MED ORDER — PREDNISONE 50 MG PO TABS: ORAL_TABLET | ORAL | 0 refills | Status: DC
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
## 2021-11-14 ENCOUNTER — Ambulatory Visit: Payer: 59 | Attending: Sports Medicine | Admitting: Physical Therapy

## 2021-11-15 ENCOUNTER — Ambulatory Visit (INDEPENDENT_AMBULATORY_CARE_PROVIDER_SITE_OTHER): Payer: 59 | Admitting: Family Medicine

## 2021-11-15 ENCOUNTER — Encounter: Payer: Self-pay | Admitting: Family Medicine

## 2021-11-15 ENCOUNTER — Other Ambulatory Visit: Payer: Self-pay

## 2021-11-15 VITALS — BP 121/66 | HR 86 | Resp 18 | Ht 60.0 in | Wt 195.0 lb

## 2021-11-15 DIAGNOSIS — Z8619 Personal history of other infectious and parasitic diseases: Secondary | ICD-10-CM

## 2021-11-15 DIAGNOSIS — F331 Major depressive disorder, recurrent, moderate: Secondary | ICD-10-CM | POA: Diagnosis not present

## 2021-11-15 MED ORDER — SERTRALINE HCL 100 MG PO TABS: 200.0000 mg | ORAL_TABLET | Freq: Every day | ORAL | 1 refills | Status: DC

## 2021-11-15 NOTE — Assessment & Plan Note (Signed)
Will adjust dose to sertraline 100 mg so she can take 2 tabs.  I did discuss with her that this is the max dose and to not take any more than that.  She is already been on this dose and she actually feels like it is effective and she is tolerating it well so we will go ahead and send a new prescription to the pharmacy.  In the meantime go ahead and place referral for therapy/counseling.  I do think family counseling would actually be pretty powerful and helpful. ?
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
## 2021-11-15 NOTE — Assessment & Plan Note (Signed)
History of hepatitis B.  She was actually supposed to follow back up with [facility], for repeat labs.  She says it was probably a year ago they had been monitoring her viral load.  We will plan to get labs today. ?

## 2021-11-17 ENCOUNTER — Encounter: Payer: Self-pay | Admitting: Family Medicine

## 2021-11-17 LAB — HEPATITIS B DNA, ULTRAQUANTITATIVE, PCR
Hepatitis B DNA (Calc): 3.16 Log IU/mL — ABNORMAL HIGH
Hepatitis B DNA: 1430 IU/mL — ABNORMAL HIGH

## 2021-11-17 LAB — COMPLETE METABOLIC PANEL WITH GFR
AG Ratio: 1.4 (calc) (ref 1.0–2.5)
ALT: 13 U/L (ref 6–29)
AST: 16 U/L (ref 10–30)
Albumin: 4.1 g/dL (ref 3.6–5.1)
Alkaline phosphatase (APISO): 96 U/L (ref 31–125)
BUN: 9 mg/dL (ref 7–25)
CO2: 23 mmol/L (ref 20–32)
Calcium: 9.1 mg/dL (ref 8.6–10.2)
Chloride: 106 mmol/L (ref 98–110)
Creat: 0.7 mg/dL (ref 0.50–0.96)
Globulin: 3 g/dL (calc) (ref 1.9–3.7)
Glucose, Bld: 75 mg/dL (ref 65–99)
Potassium: 4.5 mmol/L (ref 3.5–5.3)
Sodium: 143 mmol/L (ref 135–146)
Total Bilirubin: 0.3 mg/dL (ref 0.2–1.2)
Total Protein: 7.1 g/dL (ref 6.1–8.1)
eGFR: 123 mL/min/{1.73_m2} (ref >=60)

## 2021-11-18 NOTE — Progress Notes (Signed)
Hi Tiffine, your levels are a little higher than they were back in 2021.  Those were the last labs I could find from when your hepatitis B levels were checked was back in 2021 and compared to those your current levels are actually a little higher.  So I do think we should get you back again.  Are you able to call and make an appointment yourself or do you need a referral?  If you do need a referral please let me know the name of the provider that you were seen previously and or office name and location.

## 2021-11-22 ENCOUNTER — Other Ambulatory Visit: Payer: Self-pay | Admitting: *Deleted

## 2021-11-22 DIAGNOSIS — Z8619 Personal history of other infectious and parasitic diseases: Secondary | ICD-10-CM

## 2021-11-22 NOTE — Progress Notes (Signed)
I have no idea. We can place a new referral.

## 2021-11-24 ENCOUNTER — Ambulatory Visit: Payer: 59 | Admitting: Rehabilitative and Restorative Service Providers"

## 2021-11-25 ENCOUNTER — Ambulatory Visit: Payer: 59 | Admitting: Physical Therapy

## 2021-12-16 ENCOUNTER — Ambulatory Visit: Payer: 59 | Admitting: Sports Medicine

## 2022-03-13 ENCOUNTER — Ambulatory Visit: Payer: 59 | Admitting: Family Medicine

## 2022-06-06 IMAGING — DX DG CERVICAL SPINE COMPLETE 4+V
6 series · 6 of 6 positions shown · non-contrast
Comparison: None.

CLINICAL DATA: Bilateral neck pain radiating into shoulder.

EXAM:
CERVICAL SPINE - COMPLETE 4+ VIEW

[c-spine lat]
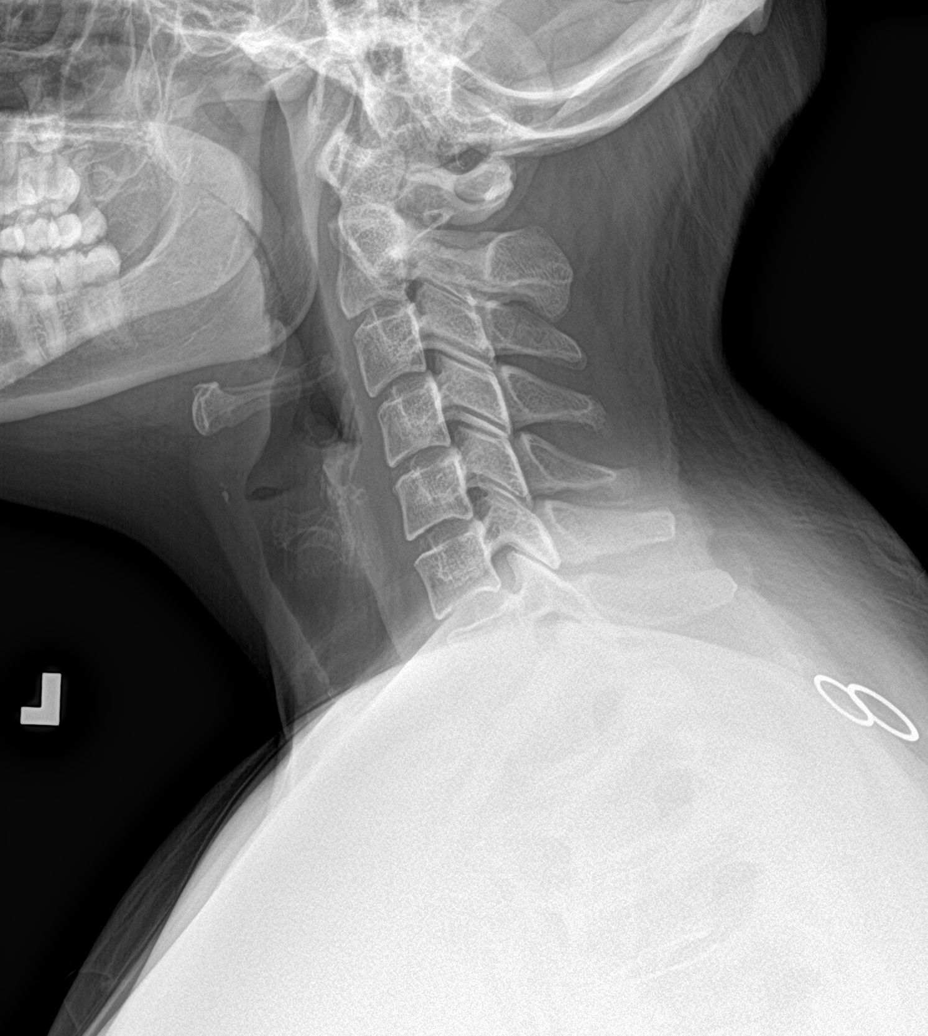

[c-spine obl (1 of 2)]
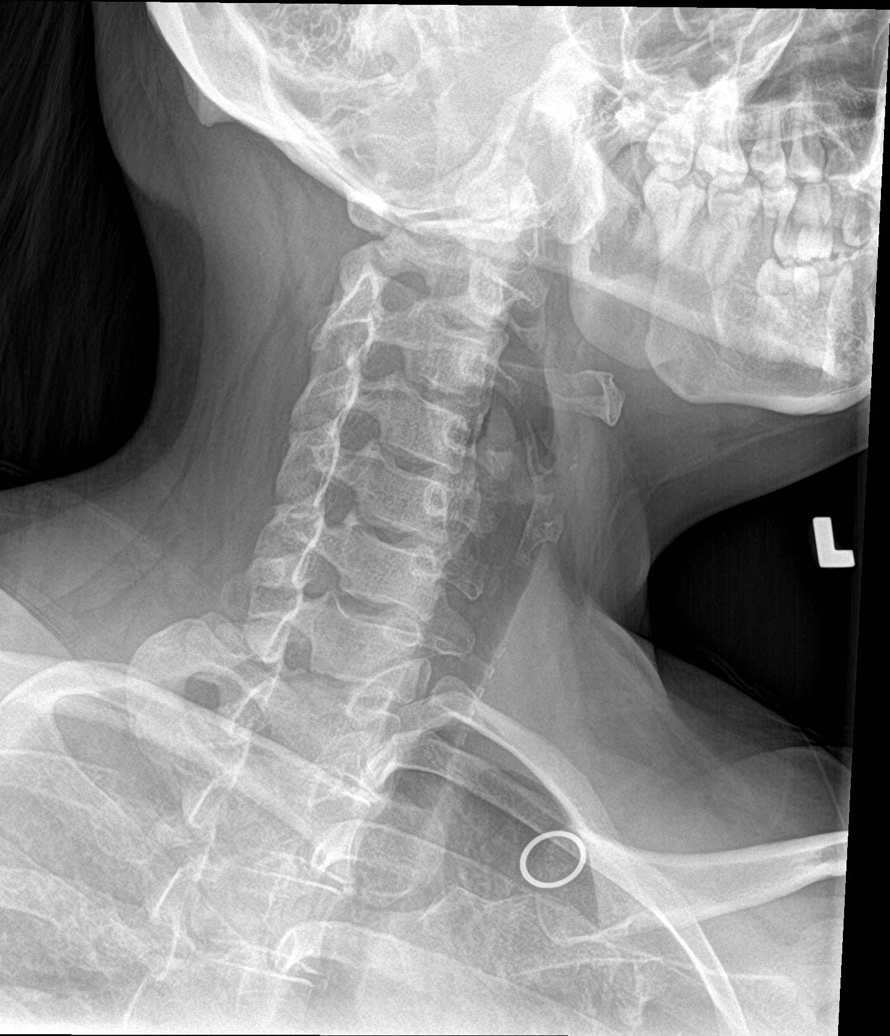

[c-spine obl (2 of 2)]
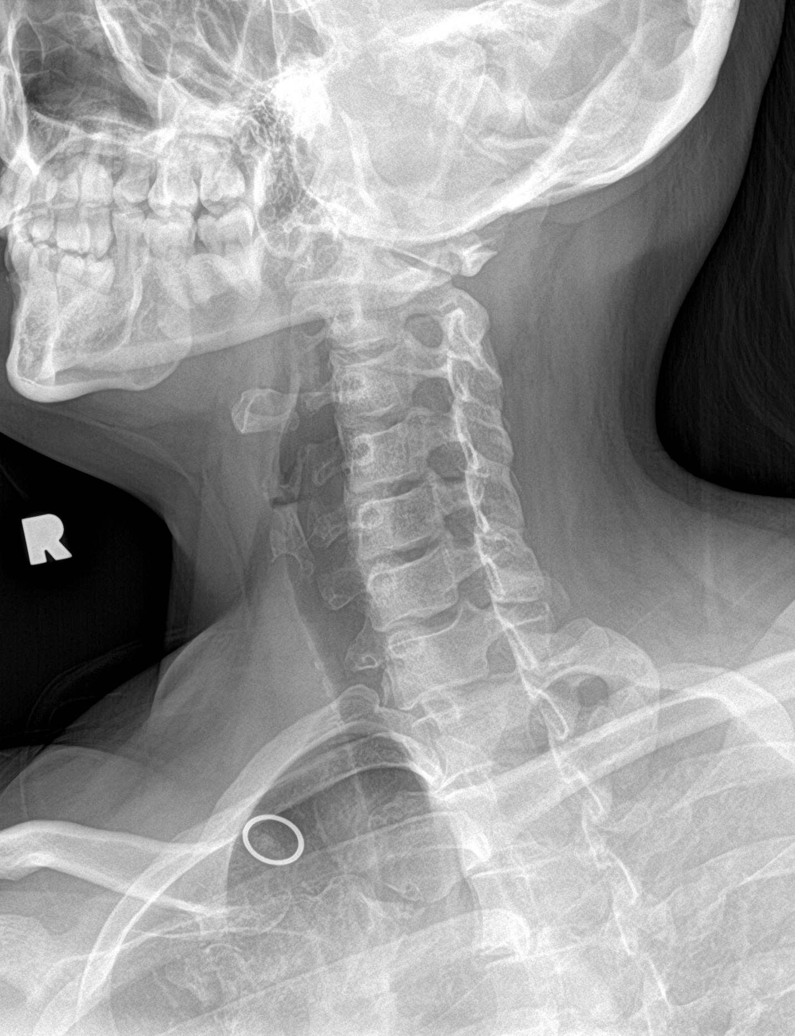

[c-spine ap]
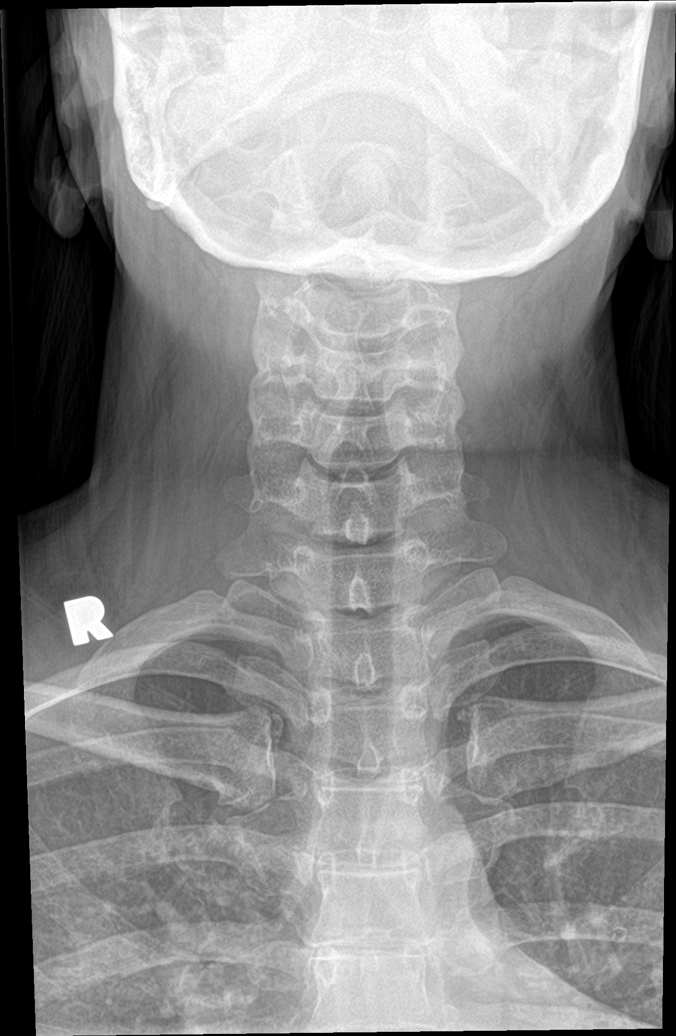

[c-spine open mouth]
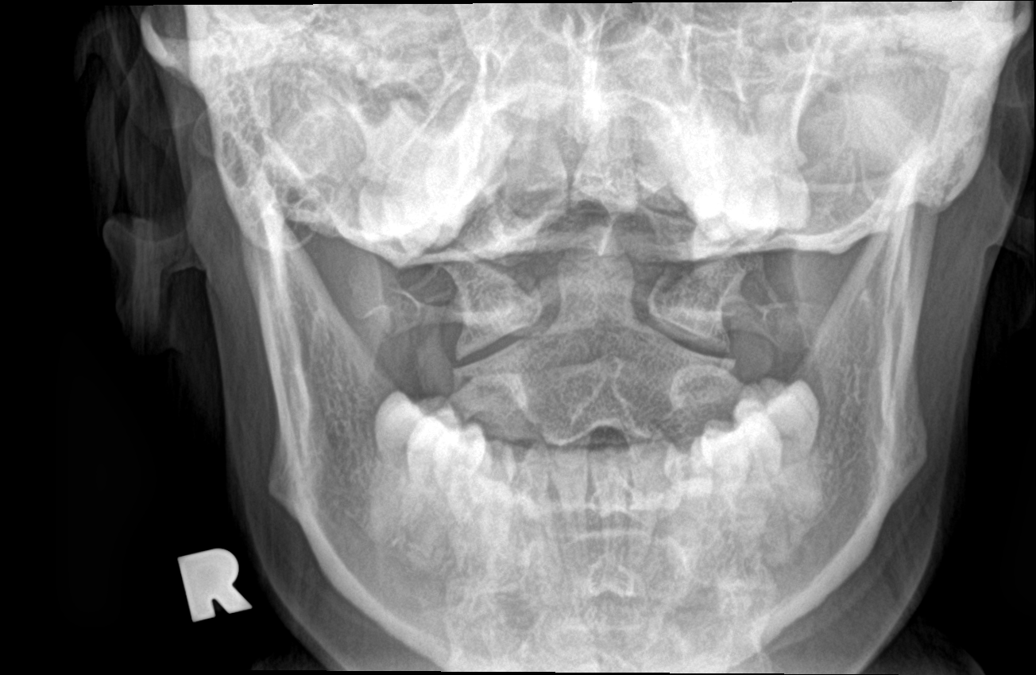

[c-spine swimmers]
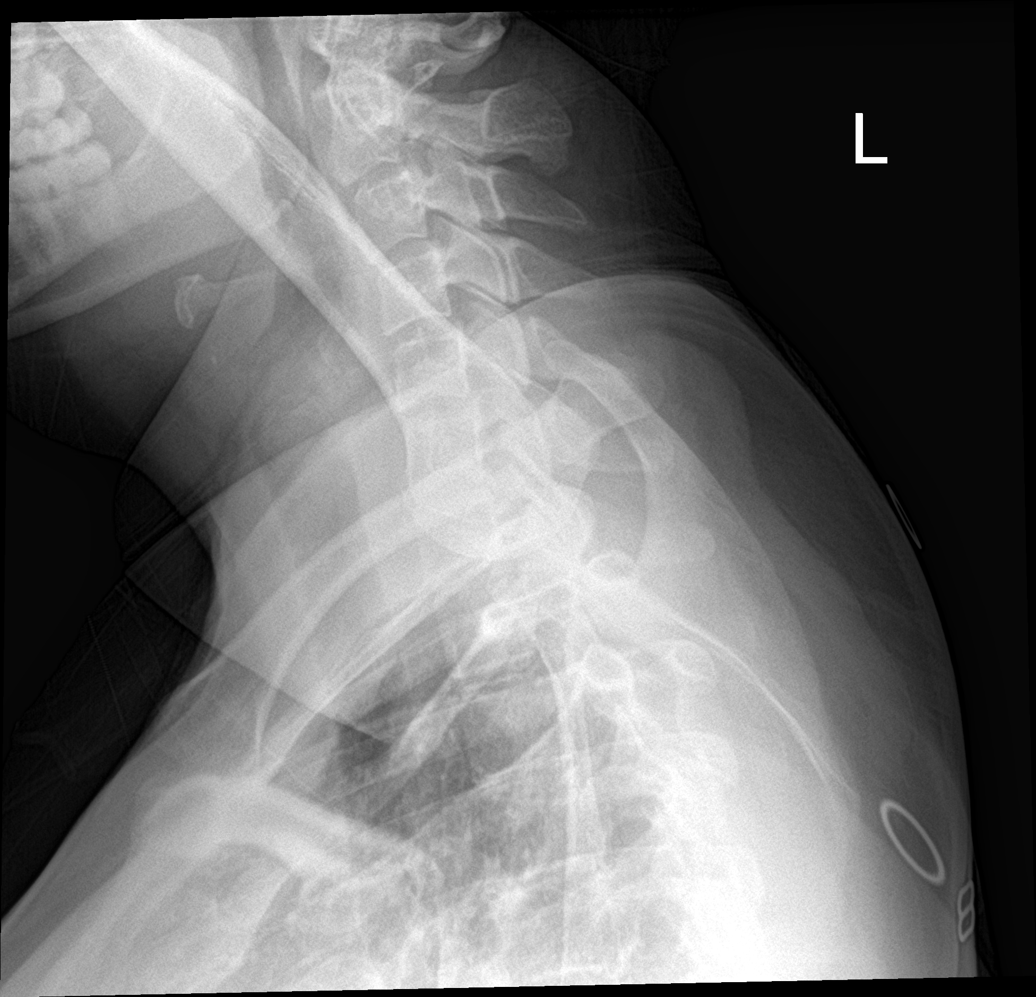

[6 of 6 positions shown; findings below may reference images not displayed]

FINDINGS: Straightening of the cervical spine. Suboptimal visualization of C7
and below. Mild disc space narrowing C4-C5. Foramen are patent
bilaterally. The dens and lateral masses are within normal limits.
IMPRESSION: Straightening of the cervical spine with mild degenerative change at
C4-C5

## 2022-10-23 ENCOUNTER — Ambulatory Visit: Payer: 59 | Admitting: Family Medicine

## 2022-11-02 ENCOUNTER — Encounter: Payer: Self-pay | Admitting: Family Medicine

## 2022-11-02 ENCOUNTER — Ambulatory Visit (INDEPENDENT_AMBULATORY_CARE_PROVIDER_SITE_OTHER): Payer: Medicaid Other | Admitting: Family Medicine

## 2022-11-02 VITALS — BP 130/64 | HR 100 | Ht 60.0 in | Wt 194.0 lb

## 2022-11-02 DIAGNOSIS — F331 Major depressive disorder, recurrent, moderate: Secondary | ICD-10-CM

## 2022-11-02 MED ORDER — PAROXETINE HCL 10 MG PO TABS: 10.0000 mg | ORAL_TABLET | Freq: Every day | ORAL | 0 refills | Status: DC

## 2022-11-02 NOTE — Assessment & Plan Note (Addendum)
Discussed options.  Has not taken the sertraline in a most a month so no taper needed.  Will start Paxil.  We discussed trying to work to find something that works well for her and that she feels good on but she really has to be very consistent with taking the medication to really give it a chance to see if it is can to be helpful.  We also discussed referral for therapy/counseling I think it could be incredibly helpful for her she has an enormous amount of stress on her shoulders right now.  Referral placed.  Plan to follow back up in 3 weeks.    [place] Office Visit from 11/02/2022 in [place] at [facility]  PHQ-9 Total Score 23         11/02/2022   10:43 AM 07/13/2020    1:41 PM 01/02/2020    3:22 PM 12/12/2019    2:12 PM  GAD 7 : Generalized Anxiety Score  Nervous, Anxious, on Edge '2 2 2 2  '$ Control/stop worrying '3 3 1 2  '$ Worry too much - different things '3 3 2 2  '$ Trouble relaxing '3 3 2 3  '$ Restless '2 3 1 2  '$ Easily annoyed or irritable '3 3 2 3  '$ Afraid - awful might happen 2 3 0 0  Total GAD 7 Score '18 20 10 14  '$ Anxiety Difficulty Somewhat difficult Very difficult Somewhat difficult Somewhat difficult

## 2022-11-02 NOTE — Progress Notes (Signed)
Established Patient Office Visit  Subjective   Patient ID: Shirley Olson, female    DOB: Jun 10, 1996  Age: 27 y.o. MRN: [id number]  Chief Complaint  Patient presents with   Follow-up    HPI She is here today in regards to her mood.  She says she is here today because her mom made the appointment her mom who is not here today is concerned that she may have bipolar disorder.  She has been evaluated in the past and felt like it was not bipolar..  There has been some debate in the past.   saw a therapist back in 2013 .    Has tried fluoxetine, Lexapro, quetiapine and Abilify, Zyprexa.  Most recently on sertraline but she says that when she takes it it makes her whole body feels "weird".  She is open to trying something different.  Just feels like she has a lot of stress on her shoulders.  She goes to work early in the mornings to help support her 2 children but lives with her mom and her brother the relationship between them is not great and she says there is a lot of yelling that goes on in the house between her and her mom and her and her children.  She feels like her children do not listen to her when her mother is around and that is really frustrating.  Work is okay but having some issues there as well but does not want to lose her job.    ROS    Objective:     BP 130/64 (BP Location: Left Arm, Patient Position: Sitting, Cuff Size: Large)   Pulse 100   Ht 5' (1.524 m)   Wt 194 lb 0.6 oz (88 kg)   SpO2 98%   BMI 37.90 kg/m    Physical Exam Vitals and nursing note reviewed.  Constitutional:      Appearance: She is well-developed.  HENT:     Head: Normocephalic and atraumatic.  Cardiovascular:     Rate and Rhythm: Normal rate and regular rhythm.     Heart sounds: Normal heart sounds.  Pulmonary:     Effort: Pulmonary effort is normal.     Breath sounds: Normal breath sounds.  Skin:    General: Skin is warm and dry.  Neurological:     Mental Status: She is alert and  oriented to person, place, and time.  Psychiatric:        Behavior: Behavior normal.      No results found for any visits on 11/02/22.    The ASCVD Risk score (Arnett DK, et al., 2019) failed to calculate for the following reasons:   The 2019 ASCVD risk score is only valid for ages 43 to 9    Assessment & Plan:   Problem List Items Addressed This Visit       Other   MDD (major depressive disorder), recurrent episode ([place]) - Primary    Discussed options.  Has not taken the sertraline in a most a month so no taper needed.  Will start Paxil.  We discussed trying to work to find something that works well for her and that she feels good on but she really has to be very consistent with taking the medication to really give it a chance to see if it is can to be helpful.  We also discussed referral for therapy/counseling I think it could be incredibly helpful for her she has an enormous amount of stress on her shoulders  right now.  Referral placed.  Plan to follow back up in 3 weeks.    [place] Office Visit from 11/02/2022 in [place] at [facility]  PHQ-9 Total Score 23         11/02/2022   10:43 AM 07/13/2020    1:41 PM 01/02/2020    3:22 PM 12/12/2019    2:12 PM  GAD 7 : Generalized Anxiety Score  Nervous, Anxious, on Edge '2 2 2 2  '$ Control/stop worrying '3 3 1 2  '$ Worry too much - different things '3 3 2 2  '$ Trouble relaxing '3 3 2 3  '$ Restless '2 3 1 2  '$ Easily annoyed or irritable '3 3 2 3  '$ Afraid - awful might happen 2 3 0 0  Total GAD 7 Score '18 20 10 14  '$ Anxiety Difficulty Somewhat difficult Very difficult Somewhat difficult Somewhat difficult         Relevant Medications   PARoxetine (PAXIL) 10 MG tablet   Other Relevant Orders   Ambulatory referral to [employer] [facility]    Return in about 3 weeks (around 11/23/2022) for New start medication - mood.    Beatrice Lecher, MD

## 2022-11-07 ENCOUNTER — Telehealth: Payer: Self-pay | Admitting: Family Medicine

## 2022-11-07 DIAGNOSIS — F331 Major depressive disorder, recurrent, moderate: Secondary | ICD-10-CM

## 2022-11-07 NOTE — Telephone Encounter (Signed)
Orders Placed This Encounter  Procedures   Ambulatory referral to Behavioral Health    Referral Priority:   Routine    Referral Type:   Psychiatric    Referral Reason:   Specialty Services Required    Requested Specialty:   Behavioral Health    Number of Visits Requested:   1    

## 2022-11-23 ENCOUNTER — Ambulatory Visit (INDEPENDENT_AMBULATORY_CARE_PROVIDER_SITE_OTHER): Payer: Medicaid Other | Admitting: Family Medicine

## 2022-11-23 ENCOUNTER — Encounter: Payer: Self-pay | Admitting: Family Medicine

## 2022-11-23 VITALS — BP 122/56 | HR 84 | Ht 60.0 in | Wt 194.0 lb

## 2022-11-23 DIAGNOSIS — F331 Major depressive disorder, recurrent, moderate: Secondary | ICD-10-CM | POA: Diagnosis not present

## 2022-11-23 DIAGNOSIS — S46811A Strain of other muscles, fascia and tendons at shoulder and upper arm level, right arm, initial encounter: Secondary | ICD-10-CM

## 2022-11-23 DIAGNOSIS — M25511 Pain in right shoulder: Secondary | ICD-10-CM | POA: Diagnosis not present

## 2022-11-23 MED ORDER — CYCLOBENZAPRINE HCL 10 MG PO TABS: 10.0000 mg | ORAL_TABLET | Freq: Every evening | ORAL | 0 refills | Status: AC | PRN

## 2022-11-23 MED ORDER — MELOXICAM 15 MG PO TABS: 15.0000 mg | ORAL_TABLET | Freq: Every day | ORAL | 0 refills | Status: AC | PRN

## 2022-11-23 MED ORDER — PAROXETINE HCL 20 MG PO TABS: 20.0000 mg | ORAL_TABLET | Freq: Every day | ORAL | 1 refills | Status: DC

## 2022-11-23 NOTE — Assessment & Plan Note (Signed)
She has had a 5 point improvement in her depression score and a 4 point improvement in her anxiety scores so I do feel like the medication is starting to really work well.  We discussed going up to 20 mg but moving it to bedtime since she is getting a little nausea right after taking the pill though it does wear off after few hours.  If she still just not able to tolerate it then we will look at other options.  Wise follow-up in 1 month.  Encouraged her to keep her therapy appointment for April I think this to be really helpful for her.

## 2022-11-23 NOTE — Patient Instructions (Signed)
Try to ice the area for 5 minutes if possible. Okay to take the meloxicam which is for inflammation and pain in the mornings before you go to work.  Just take it once a day as needed. Muscle relaxer which is cyclobenzaprine only take at bedtime as it can cause sedation. Try to do the exercises daily. If you are not feeling better after a couple of weeks then we will get you in with our sports medicine doctor.

## 2022-11-23 NOTE — Progress Notes (Signed)
Established Patient Office Visit  Subjective   Patient ID: Shirley Olson, female    DOB: 1995/11/16  Age: 27 y.o. MRN: [id number]  Chief Complaint  Patient presents with   mood    Previous PHQ= 23/SWD GAD=18/SWD    HPI  F/U Moderate depression - here today for new start Paxil.  She does feel like it has helped some but she is been noticing that she feels nauseated right after she takes it for several hours.  She says when she even vomited.  Does have an appointment in April with a therapist in [place].      11/23/2022   10:02 AM 11/02/2022   10:43 AM 11/15/2021    4:08 PM  PHQ9 SCORE ONLY  PHQ-9 Total Score 18 23 25       11/23/2022   10:02 AM 11/02/2022   10:43 AM 07/13/2020    1:41 PM 01/02/2020    3:22 PM  GAD 7 : Generalized Anxiety Score  Nervous, Anxious, on Edge 2 2 2 2   Control/stop worrying 2 3 3 1   Worry too much - different things 2 3 3 2   Trouble relaxing 2 3 3 2   Restless 2 2 3 1   Easily annoyed or irritable 2 3 3 2   Afraid - awful might happen 2 2 3  0  Total GAD 7 Score 14 18 20 10   Anxiety Difficulty Somewhat difficult Somewhat difficult Very difficult Somewhat difficult     Also been having a lot of pain in her right shoulder starting in the back of the shoulder and then going down her arm.  She is does do a lot of heavy lifting at work and in fact on Sunday she lifted a 50 pound bag of sugar.  She has not been trying any treatments such as ice.  It started bothering her 4 days ago.  She does have a history of carpal tunnel in that right wrist and hand.    ROS    Objective:     BP (!) 122/56   Pulse 84   Ht 5' (1.524 m)   Wt 194 lb (88 kg)   SpO2 99%   BMI 37.89 kg/m    Physical Exam Vitals reviewed.  Constitutional:      Appearance: She is well-developed.  HENT:     Head: Normocephalic and atraumatic.  Eyes:     Conjunctiva/sclera: Conjunctivae normal.  Cardiovascular:     Rate and Rhythm: Normal rate.  Pulmonary:     Effort:  Pulmonary effort is normal.  Musculoskeletal:     Comments: Right shoulder with normal range of motion though she did have pain with full extension and with internal rotation and most pain with crossover.  Normal external rotation.  She is tender mostly over the trapezius muscle from the top of the shoulder down between the scapula and the spine.  Nontender over the shoulder joint itself.  Skin:    General: Skin is dry.     Coloration: Skin is not pale.  Neurological:     Mental Status: She is alert and oriented to person, place, and time.  Psychiatric:        Behavior: Behavior normal.      No results found for any visits on 11/23/22.    The ASCVD Risk score (Arnett DK, et al., 2019) failed to calculate for the following reasons:   The 2019 ASCVD risk score is only valid for ages 64 to 90    Assessment & Plan:  Problem List Items Addressed This Visit       Other   MDD (major depressive disorder), recurrent episode ([place]) - Primary    She has had a 5 point improvement in her depression score and a 4 point improvement in her anxiety scores so I do feel like the medication is starting to really work well.  We discussed going up to 20 mg but moving it to bedtime since she is getting a little nausea right after taking the pill though it does wear off after few hours.  If she still just not able to tolerate it then we will look at other options.  Wise follow-up in 1 month.  Encouraged her to keep her therapy appointment for April I think this to be really helpful for her.      Relevant Medications   PARoxetine (PAXIL) 20 MG tablet   Other Visit Diagnoses     Acute pain of right shoulder       Relevant Medications   meloxicam (MOBIC) 15 MG tablet   cyclobenzaprine (FLEXERIL) 10 MG tablet   Strain of right trapezius muscle, initial encounter       Relevant Medications   meloxicam (MOBIC) 15 MG tablet   cyclobenzaprine (FLEXERIL) 10 MG tablet      Right shoulder pain with  strain of trapezius-I do not see anything to indicate a rotator cuff tear on exam today.  But she is tender mostly over the trapezius.  We discussed an anti-inflammatory for 5 days and then as needed and using a muscle relaxer at night as needed.  Try to ice the area for 5 to 10 minutes if possible daily.  If not improving in the next 3 weeks will schedule with sports medicine.  Return in about 4 weeks (around 12/21/2022) for change dose in med/Mood .    Beatrice Lecher, MD

## 2022-12-21 ENCOUNTER — Ambulatory Visit (INDEPENDENT_AMBULATORY_CARE_PROVIDER_SITE_OTHER): Payer: Medicaid Other | Admitting: Family Medicine

## 2022-12-21 VITALS — BP 125/73 | HR 74 | Ht 60.0 in | Wt 196.0 lb

## 2022-12-21 DIAGNOSIS — S8012XA Contusion of left lower leg, initial encounter: Secondary | ICD-10-CM | POA: Diagnosis not present

## 2022-12-21 DIAGNOSIS — T22151A Burn of first degree of right shoulder, initial encounter: Secondary | ICD-10-CM

## 2022-12-21 DIAGNOSIS — T22231A Burn of second degree of right upper arm, initial encounter: Secondary | ICD-10-CM

## 2022-12-21 DIAGNOSIS — F331 Major depressive disorder, recurrent, moderate: Secondary | ICD-10-CM

## 2022-12-21 DIAGNOSIS — Z8619 Personal history of other infectious and parasitic diseases: Secondary | ICD-10-CM

## 2022-12-21 MED ORDER — PAROXETINE HCL 40 MG PO TABS: 40.0000 mg | ORAL_TABLET | Freq: Every day | ORAL | 0 refills | Status: AC

## 2022-12-21 NOTE — Assessment & Plan Note (Signed)
Discussed options we will increase Paxil to 40 mg.  Follow-up in 6 weeks.  Call if any problems or concerns did encourage her to reschedule to get back in with a therapist since she had to work and missed the appointment for this month.  But just reminded her that if she no-shows a second time they may opt not to see her completely.

## 2022-12-21 NOTE — Assessment & Plan Note (Signed)
No longer takes her health insurance organ to have to find another GI office that will see her.

## 2022-12-21 NOTE — Progress Notes (Signed)
Established Patient Office Visit  Subjective   Patient ID: Shirley Olson, female    DOB: 1995-12-07  Age: 27 y.o. MRN: [id number]  Chief Complaint  Patient presents with   mood    Previous PHQ= 18/SWD GAD=14/SWD    HPI  Week follow-up for moderate depression.  She was then restarted on Paxil.  We adjusted her dose at last office visit.  I also encouraged her to move it to bedtime because she was noticing a little bit of nausea on the medication.  It would last a few hours right after taking it.  She actually feels like the medication has been helpful and she is tolerating it well in fact she says she like to maybe try going up a little bit.  Right now her biggest frustration is work she just fine she gets really irritable and stressed at work it gets really busy and has a hard time relaxing.  She still reports feeling down and depressed more than half the time but not every day and rates her symptoms as somewhat difficult in regards to depression and anxiety.  Also is requesting a new referral for the hepatitis B diagnosis.  She did get in touch with But they do not take her type of Medicaid and so she is going to need a new referral.  He also has a burn on her right arm near the antecubital fossa and says it still tender and wanted me to look at it today.  She also has a burn on her upper shoulder where she was trying to take something out of the oven and the liquid spilled on her right shoulder.  She also has a contusion on her left anterior shin that happened she was try to get into her brother's truck and her foot slipped and scraped against the side of the truck.  It still a little swollen and tender.    ROS    Objective:     BP 125/73   Pulse 74   Ht 5' (1.524 m)   Wt 196 lb (88.9 kg)   SpO2 99%   BMI 38.28 kg/m    Physical Exam Vitals and nursing note reviewed.  Constitutional:      Appearance: She is well-developed.  HENT:     Head: Normocephalic and atraumatic.   Cardiovascular:     Rate and Rhythm: Normal rate and regular rhythm.     Heart sounds: Normal heart sounds.  Pulmonary:     Effort: Pulmonary effort is normal.     Breath sounds: Normal breath sounds.  Skin:    General: Skin is warm and dry.     Comments: She does have an irregular area of erythema on her right upper shoulder.  On her right arm just above the antecubital fossa she has a burn that has scabbed and is in the healing mode but no active drainage or surrounding erythema.  On her left lower shin she has a pretty large contusion it looks to be healing.  And a small lump it is very tender to touch.  But no concern for secondary infection.  Neurological:     Mental Status: She is alert and oriented to person, place, and time.  Psychiatric:        Behavior: Behavior normal.      No results found for any visits on 12/21/22.    The ASCVD Risk score (Arnett DK, et al., 2019) failed to calculate for the following reasons:   The  2019 ASCVD risk score is only valid for ages 44 to 49    Assessment & Plan:   Problem List Items Addressed This Visit       Other   MDD (major depressive disorder), recurrent episode - Primary    Discussed options we will increase Paxil to 40 mg.  Follow-up in 6 weeks.  Call if any problems or concerns did encourage her to reschedule to get back in with a therapist since she had to work and missed the appointment for this month.  But just reminded her that if she no-shows a second time they may opt not to see her completely.      Relevant Medications   PARoxetine (PAXIL) 40 MG tablet   History of hepatitis B    No longer takes her health insurance organ to have to find another GI office that will see her.      Relevant Orders   Ambulatory referral to Gastroenterology   Other Visit Diagnoses     Partial thickness burn of right upper arm, initial encounter       Superficial burn of right shoulder, initial encounter       Contusion of left  lower extremity, initial encounter          Follow-up burn care discussed.  Additional handout information given.  Contusion-once it is no longer tender recommend gentle massage over the area to reduce any potential calcification of the hematoma.  But it should resolve hopefully in the next 3 to 4 weeks.  Return in about 6 weeks (around 02/01/2023) for Medication adjustment/Mood.    Nani Gasser, MD

## 2023-02-01 ENCOUNTER — Ambulatory Visit: Payer: Medicaid Other | Admitting: Family Medicine

## 2023-03-16 ENCOUNTER — Encounter: Payer: Self-pay | Admitting: Emergency Medicine

## 2023-03-16 ENCOUNTER — Ambulatory Visit
Admission: EM | Admit: 2023-03-16 | Discharge: 2023-03-16 | Disposition: A | Discharge status: Home / Self Care | Payer: Medicaid Other | Attending: Family Medicine | Admitting: Family Medicine

## 2023-03-16 DIAGNOSIS — R1011 Right upper quadrant pain: Secondary | ICD-10-CM | POA: Diagnosis not present

## 2023-03-16 LAB — POCT URINALYSIS DIP (MANUAL ENTRY)
Bilirubin, UA: NEGATIVE
Blood, UA: NEGATIVE
Glucose, UA: NEGATIVE mg/dL
Ketones, POC UA: NEGATIVE mg/dL
Nitrite, UA: NEGATIVE
Protein Ur, POC: NEGATIVE mg/dL
Spec Grav, UA: >=1.03 — AB (ref 1.010–1.025)
Urobilinogen, UA: 1 E.U./dL
pH, UA: 6.5 (ref 5.0–8.0)

## 2023-03-16 NOTE — ED Provider Notes (Signed)
[facility]    CSN: [id number] Arrival date & time: 03/16/23  1258      History   Chief Complaint Chief Complaint  Patient presents with   Liver Pain    HPI Shirley Olson is a 27 y.o. female.   Patient complains of onset 2 days ago of sharp/constant right upper quadrant pain that she describes as "liver pain."  She has a history of hepatitis B, although review of records reveals normal LFT's on CMP done 11/15/21.  She states that she has had nausea without vomiting and denies fevers, chills, and sweats.  She denies urinary symptoms.  Her pain does not radiate and is worse with heavy lifting.  Her pain is not affected by eating although her appetite is decreased.  The history is provided by the patient.    Past Medical History:  Diagnosis Date   Arm fracture, left    distal radius, buckle fracture   Arm fracture, right 2009 for a   Distal radius    Patient Active Problem List   Diagnosis Date Noted   History of hepatitis B 11/15/2021   Chronic neck pain 11/04/2021   Bilateral carpal tunnel syndrome 08/11/2020   Post partum depression 07/13/2020   Postpartum care following vaginal delivery 04/24/2020   Carrier of group B Streptococcus 04/07/2020   Inattention 06/10/2019   Right foot pain 05/02/2019   MDD (major depressive disorder), recurrent episode ([facility]) 08/09/2018   Obesity (BMI 30-39.9) 01/22/2018   Post-dates pregnancy 01/19/2018    Past Surgical History:  Procedure Laterality Date   TONSILLECTOMY     TONSILLECTOMY AND ADENOIDECTOMY  08/2006    OB History     Gravida  1   Para      Term      Preterm      AB      Living         SAB      IAB      Ectopic      Multiple      Live Births               Home Medications    Prior to Admission medications   Medication Sig Start Date End Date Taking? Authorizing Provider  cyclobenzaprine (FLEXERIL) 10 MG tablet Take 1 tablet (10 mg total) by mouth at bedtime as needed for  muscle spasms. 11/23/22  Yes Agapito Games, MD  meloxicam (MOBIC) 15 MG tablet Take 1 tablet (15 mg total) by mouth daily as needed for pain. 11/23/22  Yes Agapito Games, MD  PARoxetine (PAXIL) 40 MG tablet Take 1 tablet (40 mg total) by mouth at bedtime. 12/21/22  Yes Agapito Games, MD    Family History Family History  Problem Relation Age of Onset   Depression Other    Diabetes Other     Social History Social History   Tobacco Use   Smoking status: Never   Smokeless tobacco: Never  Substance Use Topics   Alcohol use: No   Drug use: No     Allergies   Patient has no known allergies.   Review of Systems Review of Systems  Constitutional:  Positive for activity change and appetite change. Negative for chills, diaphoresis, fatigue and fever.  Gastrointestinal:  Positive for abdominal pain and nausea. Negative for abdominal distention, constipation, diarrhea and vomiting.  All other systems reviewed and are negative.    Physical Exam Triage Vital Signs ED Triage Vitals  Encounter Vitals Group  BP 03/16/23 1405 104/67     Systolic BP Percentile --      Diastolic BP Percentile --      Pulse Rate 03/16/23 1405 81     Resp 03/16/23 1405 18     Temp 03/16/23 1405 98.2 F (36.8 C)     Temp Source 03/16/23 1405 Oral     SpO2 03/16/23 1405 99 %     Weight --      Height 03/16/23 1406 5' (1.524 m)     Head Circumference --      Peak Flow --      Pain Score 03/16/23 1405 9     Pain Loc --      Pain Education --      Exclude from Growth Chart --    No data found.  Updated Vital Signs BP 104/67 (BP Location: Right Arm)   Pulse 81   Temp 98.2 F (36.8 C) (Oral)   Resp 18   Ht 5' (1.524 m)   SpO2 99%   BMI 38.28 kg/m   Visual Acuity Right Eye Distance:   Left Eye Distance:   Bilateral Distance:    Right Eye Near:   Left Eye Near:    Bilateral Near:     Physical Exam Vitals and nursing note reviewed.  Constitutional:       General: She is not in acute distress. HENT:     Head: Normocephalic.     Mouth/Throat:     Mouth: Mucous membranes are moist.  Eyes:     Pupils: Pupils are equal, round, and reactive to light.  Cardiovascular:     Rate and Rhythm: Normal rate and regular rhythm.     Heart sounds: Normal heart sounds.  Pulmonary:     Breath sounds: Normal breath sounds.  Abdominal:     General: Bowel sounds are normal. There is no distension.     Palpations: Abdomen is soft. There is no hepatomegaly or splenomegaly.     Tenderness: There is abdominal tenderness in the right upper quadrant. There is no right CVA tenderness, left CVA tenderness, guarding or rebound. Positive signs include Murphy's sign. Negative signs include Rovsing's sign, McBurney's sign, psoas sign and obturator sign.     Hernia: No hernia is present.    Musculoskeletal:     Cervical back: Neck supple.     Right lower leg: No edema.     Left lower leg: No edema.  Lymphadenopathy:     Cervical: No cervical adenopathy.  Skin:    General: Skin is warm and dry.     Findings: No rash.  Neurological:     Mental Status: She is alert and oriented to person, place, and time.      UC Treatments / Results  Labs (all labs ordered are listed, but only abnormal results are displayed) Labs Reviewed  POCT URINALYSIS DIP (MANUAL ENTRY) - Abnormal; Notable for the following components:      Result Value   Spec Grav, UA >=1.030 (*)    Leukocytes, UA Trace (*)    All other components within normal limits  COMPREHENSIVE METABOLIC PANEL    EKG   Radiology No results found.  Procedures Procedures (including critical care time)  Medications Ordered in UC Medications - No data to display  Initial Impression / Assessment and Plan / UC Course  I have reviewed the triage vital signs and the nursing notes.  Pertinent labs & imaging results that were available during my care  of the patient were reviewed by me and considered in my  medical decision making (see chart for details).    ?biliary colic.  No bilirubin on urinalysis. Will check CMP.  Unable to do GB US today (patient not fasting).  Patient will be out of town for 10 days; return then if still symptomatic.  Final Clinical Impressions(s) / UC Diagnoses   Final diagnoses:  Abdominal pain, right upper quadrant     Discharge Instructions      May return in 10 days for ultrasound of right upper abdomen if your symptoms are still present.    If symptoms become significantly worse during the night or over the weekend, proceed to the local emergency room.     ED Prescriptions   None       Lattie Haw, MD 03/17/23 2209

## 2023-03-16 NOTE — ED Triage Notes (Signed)
Patient states that she is having liver pain since yesterday.  Some nausea, no vomiting.  Patient has taken OTC pain relief.

## 2023-03-16 NOTE — Discharge Instructions (Signed)
May return in 10 days for ultrasound of right upper abdomen if your symptoms are still present.    If symptoms become significantly worse during the night or over the weekend, proceed to the local emergency room.

## 2023-03-17 LAB — COMPREHENSIVE METABOLIC PANEL
ALT: 20 IU/L (ref 0–32)
AST: 21 IU/L (ref 0–40)
Albumin: 4.3 g/dL (ref 4.0–5.0)
Alkaline Phosphatase: 105 IU/L (ref 44–121)
BUN/Creatinine Ratio: 16 (ref 9–23)
BUN: 13 mg/dL (ref 6–20)
Bilirubin Total: <0.2 mg/dL (ref 0.0–1.2)
CO2: 24 mmol/L (ref 20–29)
Calcium: 9.1 mg/dL (ref 8.7–10.2)
Chloride: 104 mmol/L (ref 96–106)
Creatinine, Ser: 0.79 mg/dL (ref 0.57–1.00)
Globulin, Total: 2.5 g/dL (ref 1.5–4.5)
Glucose: 77 mg/dL (ref 70–99)
Potassium: 5 mmol/L (ref 3.5–5.2)
Sodium: 141 mmol/L (ref 134–144)
Total Protein: 6.8 g/dL (ref 6.0–8.5)
eGFR: 106 mL/min/{1.73_m2} (ref >59)

## 2023-05-10 ENCOUNTER — Encounter: Payer: Self-pay | Admitting: Emergency Medicine

## 2023-05-10 ENCOUNTER — Ambulatory Visit
Admission: EM | Admit: 2023-05-10 | Discharge: 2023-05-10 | Disposition: A | Discharge status: Home / Self Care | Payer: Medicaid Other | Attending: Family Medicine | Admitting: Family Medicine

## 2023-05-10 DIAGNOSIS — J0100 Acute maxillary sinusitis, unspecified: Secondary | ICD-10-CM

## 2023-05-10 DIAGNOSIS — R0981 Nasal congestion: Secondary | ICD-10-CM

## 2023-05-10 MED ORDER — PREDNISONE 20 MG PO TABS: ORAL_TABLET | ORAL | 0 refills | Status: AC

## 2023-05-10 MED ORDER — AMOXICILLIN-POT CLAVULANATE 875-125 MG PO TABS: 1.0000 | ORAL_TABLET | Freq: Two times a day (BID) | ORAL | 0 refills | Status: AC

## 2023-05-10 NOTE — ED Provider Notes (Signed)
[facility]    CSN: [id number] Arrival date & time: 05/10/23  1950      History   Chief Complaint Chief Complaint  Patient presents with   Facial Pain    HPI Shirley Olson is a 27 y.o. female.   HPI  27 year old female presents with sinus nasal congestion and facial pain for 2 days .  PMH significant for obesity, chronic neck pain, and inattention. Past Medical History:  Diagnosis Date   Arm fracture, left    distal radius, buckle fracture   Arm fracture, right 2009 for a   Distal radius    Patient Active Problem List   Diagnosis Date Noted   History of hepatitis B 11/15/2021   Chronic neck pain 11/04/2021   Bilateral carpal tunnel syndrome 08/11/2020   Post partum depression 07/13/2020   Postpartum care following vaginal delivery 04/24/2020   Carrier of group B Streptococcus 04/07/2020   Inattention 06/10/2019   Right foot pain 05/02/2019   MDD (major depressive disorder), recurrent episode (HCC) 08/09/2018   Obesity (BMI 30-39.9) 01/22/2018   Post-dates pregnancy 01/19/2018    Past Surgical History:  Procedure Laterality Date   TONSILLECTOMY     TONSILLECTOMY AND ADENOIDECTOMY  08/2006    OB History     Gravida  1   Para      Term      Preterm      AB      Living         SAB      IAB      Ectopic      Multiple      Live Births               Home Medications    Prior to Admission medications   Medication Sig Start Date End Date Taking? Authorizing Provider  amoxicillin-clavulanate (AUGMENTIN) 875-125 MG tablet Take 1 tablet by mouth 2 (two) times daily for 10 days. 05/10/23 05/20/23 Yes Trevor Iha, FNP  PARoxetine (PAXIL) 40 MG tablet Take 1 tablet (40 mg total) by mouth at bedtime. 12/21/22  Yes Agapito Games, MD  predniSONE (DELTASONE) 20 MG tablet Take 3 tabs PO daily x 5 days. 05/10/23  Yes Trevor Iha, FNP  cyclobenzaprine (FLEXERIL) 10 MG tablet Take 1 tablet (10 mg total) by mouth at bedtime as  needed for muscle spasms. 11/23/22   Agapito Games, MD  meloxicam (MOBIC) 15 MG tablet Take 1 tablet (15 mg total) by mouth daily as needed for pain. 11/23/22   Agapito Games, MD    Family History Family History  Problem Relation Age of Onset   Depression Other    Diabetes Other     Social History Social History   Tobacco Use   Smoking status: Never   Smokeless tobacco: Never  Substance Use Topics   Alcohol use: No   Drug use: No     Allergies   Patient has no known allergies.   Review of Systems Review of Systems  HENT:  Positive for congestion, sinus pressure and sinus pain.   All other systems reviewed and are negative.    Physical Exam Triage Vital Signs ED Triage Vitals  Encounter Vitals Group     BP      Systolic BP Percentile      Diastolic BP Percentile      Pulse      Resp      Temp      Temp src  SpO2      Weight      Height      Head Circumference      Peak Flow      Pain Score      Pain Loc      Pain Education      Exclude from Growth Chart    No data found.  Updated Vital Signs BP 123/87 (BP Location: Left Arm)   Pulse 94   Temp 98.9 F (37.2 C) (Oral)   Resp 16   SpO2 98%    Physical Exam Vitals and nursing note reviewed.  Constitutional:      Appearance: Normal appearance. She is obese.  HENT:     Head: Normocephalic and atraumatic.     Right Ear: Tympanic membrane and external ear normal.     Left Ear: Tympanic membrane and external ear normal.     Ears:     Comments: Significant eustachian tube dysfunction noted bilaterally    Nose:     Right Sinus: Maxillary sinus tenderness present.     Left Sinus: Maxillary sinus tenderness present.     Mouth/Throat:     Mouth: Mucous membranes are moist.     Pharynx: Oropharynx is clear.  Eyes:     Extraocular Movements: Extraocular movements intact.     Conjunctiva/sclera: Conjunctivae normal.     Pupils: Pupils are equal, round, and reactive to light.   Cardiovascular:     Rate and Rhythm: Normal rate and regular rhythm.     Pulses: Normal pulses.     Heart sounds: Normal heart sounds.  Pulmonary:     Effort: Pulmonary effort is normal.     Breath sounds: Normal breath sounds. No wheezing, rhonchi or rales.  Musculoskeletal:        General: Normal range of motion.     Cervical back: Normal range of motion and neck supple.  Skin:    General: Skin is warm and dry.  Neurological:     General: No focal deficit present.     Mental Status: She is alert and oriented to person, place, and time. Mental status is at baseline.  Psychiatric:        Mood and Affect: Mood normal.        Behavior: Behavior normal.      UC Treatments / Results  Labs (all labs ordered are listed, but only abnormal results are displayed) Labs Reviewed - No data to display  EKG   Radiology No results found.  Procedures Procedures (including critical care time)  Medications Ordered in UC Medications - No data to display  Initial Impression / Assessment and Plan / UC Course  I have reviewed the triage vital signs and the nursing notes.  Pertinent labs & imaging results that were available during my care of the patient were reviewed by me and considered in my medical decision making (see chart for details).     MDM: 1.  Acute maxillary sinusitis, recurrence not specified-Rx'd Augmentin 875/125 mg tablet: Take 1 tablet twice daily x 10 days; 2.  Congestion of nasal sinus-Rx'd prednisone 60 mg daily x 5 days. Instructed patient to take medication as directed with food to completion.  Advised patient to take prednisone with first dose of Augmentin for the next 5 of 10 days.  Encouraged to increase daily water intake to 64 ounces per day while taking these medications.  Advised if symptoms worsen and/or unresolved please follow-up with PCP or here for further evaluation.  Patient discharged home, hemodynamically stable. Final Clinical Impressions(s) / UC  Diagnoses   Final diagnoses:  Acute maxillary sinusitis, recurrence not specified  Congestion of nasal sinus     Discharge Instructions      Instructed patient to take medication as directed with food to completion.  Advised patient to take prednisone with first dose of Augmentin for the next 5 of 10 days.  Encouraged to increase daily water intake to 64 ounces per day while taking these medications.  Advised if symptoms worsen and/or unresolved please follow-up with PCP or here for further evaluation.     ED Prescriptions     Medication Sig Dispense Auth. Provider   amoxicillin-clavulanate (AUGMENTIN) 875-125 MG tablet Take 1 tablet by mouth 2 (two) times daily for 10 days. 20 tablet Trevor Iha, FNP   predniSONE (DELTASONE) 20 MG tablet Take 3 tabs PO daily x 5 days. 15 tablet Trevor Iha, FNP      PDMP not reviewed this encounter.   Trevor Iha, FNP 05/10/23 2005

## 2023-05-10 NOTE — Discharge Instructions (Addendum)
Instructed patient to take medication as directed with food to completion.  Advised patient to take prednisone with first dose of Augmentin for the next 5 of 10 days.  Encouraged to increase daily water intake to 64 ounces per day while taking these medications.  Advised if symptoms worsen and/or unresolved please follow-up with PCP or here for further evaluation.

## 2023-05-10 NOTE — ED Triage Notes (Signed)
Reports nasal congestion and facial pain x 2 days. Has not tried any meds at home to help symptoms. No sick contacts, no at home testing. Reports cough sore throat headache body aches when prompted.

## 2024-04-29 ENCOUNTER — Encounter: Payer: Self-pay | Admitting: Sports Medicine
